# Patient Record
Sex: Female | Born: 1975 | Race: White | Hispanic: No | Marital: Married | Smoking: Former smoker
Health system: Southern US, Community
[De-identification: ages and names within clinical notes are randomized; demographics above are authoritative.]

## PROBLEM LIST (undated history)

## (undated) DIAGNOSIS — N200 Calculus of kidney: Secondary | ICD-10-CM

## (undated) DIAGNOSIS — D649 Anemia, unspecified: Secondary | ICD-10-CM

## (undated) DIAGNOSIS — G43909 Migraine, unspecified, not intractable, without status migrainosus: Secondary | ICD-10-CM

## (undated) DIAGNOSIS — F419 Anxiety disorder, unspecified: Secondary | ICD-10-CM

## (undated) DIAGNOSIS — F32A Depression, unspecified: Secondary | ICD-10-CM

## (undated) DIAGNOSIS — E559 Vitamin D deficiency, unspecified: Secondary | ICD-10-CM

## (undated) DIAGNOSIS — I10 Essential (primary) hypertension: Secondary | ICD-10-CM

## (undated) DIAGNOSIS — Z87442 Personal history of urinary calculi: Secondary | ICD-10-CM

## (undated) DIAGNOSIS — F329 Major depressive disorder, single episode, unspecified: Secondary | ICD-10-CM

## (undated) DIAGNOSIS — G4730 Sleep apnea, unspecified: Secondary | ICD-10-CM

## (undated) DIAGNOSIS — R5382 Chronic fatigue, unspecified: Secondary | ICD-10-CM

## (undated) HISTORY — DX: Sleep apnea, unspecified: G47.30

## (undated) HISTORY — PX: CHOLECYSTECTOMY: SHX55

## (undated) HISTORY — DX: Anxiety disorder, unspecified: F41.9

## (undated) HISTORY — DX: Anemia, unspecified: D64.9

## (undated) HISTORY — PX: UMBILICAL HERNIA REPAIR: SHX196

## (undated) HISTORY — DX: Vitamin D deficiency, unspecified: E55.9

## (undated) HISTORY — PX: STENT PLACE LEFT URETER (ARMC HX): HXRAD1254

## (undated) HISTORY — DX: Chronic fatigue, unspecified: R53.82

## (undated) HISTORY — PX: TUBAL LIGATION: SHX77

## (undated) HISTORY — DX: Essential (primary) hypertension: I10

---

## 2002-07-31 ENCOUNTER — Inpatient Hospital Stay (HOSPITAL_COMMUNITY): Admission: EM | Admit: 2002-07-31 | Discharge: 2002-08-01 | Payer: Self-pay | Admitting: Psychiatry

## 2004-07-19 ENCOUNTER — Ambulatory Visit: Payer: Self-pay | Admitting: Family Medicine

## 2004-12-07 ENCOUNTER — Ambulatory Visit: Payer: Self-pay | Admitting: Family Medicine

## 2004-12-23 ENCOUNTER — Ambulatory Visit: Payer: Self-pay | Admitting: Family Medicine

## 2005-02-02 ENCOUNTER — Ambulatory Visit: Payer: Self-pay | Admitting: Family Medicine

## 2005-05-24 ENCOUNTER — Ambulatory Visit: Payer: Self-pay | Admitting: Family Medicine

## 2012-08-10 ENCOUNTER — Emergency Department: Payer: Self-pay | Admitting: Emergency Medicine

## 2012-08-10 LAB — TROPONIN I: Troponin-I: <0.02 ng/mL

## 2012-08-10 LAB — BASIC METABOLIC PANEL
Anion Gap: 9 (ref 7–16)
BUN: 11 mg/dL (ref 7–18)
Calcium, Total: 11.4 mg/dL — ABNORMAL HIGH (ref 8.5–10.1)
Chloride: 107 mmol/L (ref 98–107)
Co2: 24 mmol/L (ref 21–32)
Creatinine: 0.61 mg/dL (ref 0.60–1.30)
EGFR (African American): >60
EGFR (Non-African Amer.): >60
Glucose: 90 mg/dL (ref 65–99)
Osmolality: 278 (ref 275–301)
Potassium: 3.5 mmol/L (ref 3.5–5.1)
Sodium: 140 mmol/L (ref 136–145)

## 2012-08-10 LAB — CBC
HCT: 40.8 % (ref 35.0–47.0)
HGB: 13.5 g/dL (ref 12.0–16.0)
MCH: 28.6 pg (ref 26.0–34.0)
MCHC: 33.1 g/dL (ref 32.0–36.0)
MCV: 86 fL (ref 80–100)
Platelet: 279 10*3/uL (ref 150–440)
RBC: 4.73 10*6/uL (ref 3.80–5.20)
RDW: 13.1 % (ref 11.5–14.5)
WBC: 11.8 10*3/uL — ABNORMAL HIGH (ref 3.6–11.0)

## 2012-08-11 LAB — LIPASE, BLOOD: Lipase: 124 U/L (ref 73–393)

## 2012-08-11 LAB — MAGNESIUM: Magnesium: 1.6 mg/dL — ABNORMAL LOW

## 2012-08-11 LAB — AMYLASE: Amylase: 42 U/L (ref 25–115)

## 2013-08-06 ENCOUNTER — Emergency Department (HOSPITAL_COMMUNITY)
Admission: EM | Admit: 2013-08-06 | Discharge: 2013-08-06 | Disposition: A | Discharge status: Home / Self Care | Payer: 59 | Attending: Emergency Medicine | Admitting: Emergency Medicine

## 2013-08-06 ENCOUNTER — Encounter (HOSPITAL_COMMUNITY): Payer: Self-pay | Admitting: Emergency Medicine

## 2013-08-06 DIAGNOSIS — M549 Dorsalgia, unspecified: Secondary | ICD-10-CM | POA: Insufficient documentation

## 2013-08-06 DIAGNOSIS — R300 Dysuria: Secondary | ICD-10-CM | POA: Insufficient documentation

## 2013-08-06 DIAGNOSIS — Z79899 Other long term (current) drug therapy: Secondary | ICD-10-CM | POA: Insufficient documentation

## 2013-08-06 DIAGNOSIS — Z3202 Encounter for pregnancy test, result negative: Secondary | ICD-10-CM | POA: Insufficient documentation

## 2013-08-06 DIAGNOSIS — F3289 Other specified depressive episodes: Secondary | ICD-10-CM | POA: Insufficient documentation

## 2013-08-06 DIAGNOSIS — F329 Major depressive disorder, single episode, unspecified: Secondary | ICD-10-CM | POA: Insufficient documentation

## 2013-08-06 DIAGNOSIS — R109 Unspecified abdominal pain: Secondary | ICD-10-CM | POA: Insufficient documentation

## 2013-08-06 DIAGNOSIS — G43909 Migraine, unspecified, not intractable, without status migrainosus: Secondary | ICD-10-CM | POA: Insufficient documentation

## 2013-08-06 HISTORY — DX: Depression, unspecified: F32.A

## 2013-08-06 HISTORY — DX: Migraine, unspecified, not intractable, without status migrainosus: G43.909

## 2013-08-06 HISTORY — DX: Major depressive disorder, single episode, unspecified: F32.9

## 2013-08-06 LAB — URINALYSIS, ROUTINE W REFLEX MICROSCOPIC
Bilirubin Urine: NEGATIVE
Glucose, UA: NEGATIVE mg/dL
Ketones, ur: NEGATIVE mg/dL
Leukocytes, UA: NEGATIVE
Nitrite: NEGATIVE
Protein, ur: NEGATIVE mg/dL
Specific Gravity, Urine: 1.011 (ref 1.005–1.030)
Urobilinogen, UA: 0.2 mg/dL (ref 0.0–1.0)
pH: 6 (ref 5.0–8.0)

## 2013-08-06 LAB — PREGNANCY, URINE: Preg Test, Ur: NEGATIVE

## 2013-08-06 LAB — URINE MICROSCOPIC-ADD ON

## 2013-08-06 MED ORDER — ONDANSETRON 8 MG PO TBDP
8.0000 mg | ORAL_TABLET | Freq: Once | ORAL | Status: AC
  Administered 2013-08-06: 8 mg via ORAL
  Filled 2013-08-06: qty 1

## 2013-08-06 MED ORDER — OXYCODONE-ACETAMINOPHEN 5-325 MG PO TABS
2.0000 | ORAL_TABLET | Freq: Once | ORAL | Status: AC
  Administered 2013-08-06: 2 via ORAL
  Filled 2013-08-06: qty 2

## 2013-08-06 MED ORDER — ONDANSETRON 8 MG PO TBDP: 8.0000 mg | ORAL_TABLET | Freq: Three times a day (TID) | ORAL | Status: DC | PRN

## 2013-08-06 MED ORDER — OXYCODONE-ACETAMINOPHEN 5-325 MG PO TABS: 2.0000 | ORAL_TABLET | ORAL | Status: DC | PRN

## 2013-08-06 NOTE — ED Provider Notes (Addendum)
CSN: [id number]     Arrival date & time 08/06/13  0620 History   First MD Initiated Contact with Patient 08/06/13 [phone]     Chief Complaint  Patient presents with  . Abdominal Pain  . Back Pain     (Consider location/radiation/quality/duration/timing/severity/associated sxs/prior Treatment) Patient is a 38 y.o. female presenting with abdominal pain and back pain.  Abdominal Pain Pain location:  L flank and R flank Pain quality: pressure   Pain radiates to:  Suprapubic region Pain severity:  Moderate Onset quality:  Sudden Duration:  1 day Timing:  Constant Progression:  Worsening Chronicity:  New Relieved by:  Nothing Worsened by:  Nothing tried Ineffective treatments:  None tried Associated symptoms: dysuria   Associated symptoms: no fever, no nausea and no vomiting   Back Pain Associated symptoms: abdominal pain and dysuria   Associated symptoms: no fever     Past Medical History  Diagnosis Date  . Migraines   . Depression    History reviewed. No pertinent past surgical history. History reviewed. No pertinent family history. History  Substance Use Topics  . Smoking status: Never Smoker   . Smokeless tobacco: Not on file  . Alcohol Use: No   OB History   Grav Para Term Preterm Abortions TAB SAB Ect Mult Living                 Review of Systems  Constitutional: Negative for fever.  Gastrointestinal: Positive for abdominal pain. Negative for nausea and vomiting.  Genitourinary: Positive for dysuria.  Musculoskeletal: Positive for back pain.  All other systems reviewed and are negative.      Allergies  Review of patient's allergies indicates no known allergies.  Home Medications   Current Outpatient Rx  Name  Route  Sig  Dispense  Refill  . DULoxetine (CYMBALTA) 60 MG capsule   Oral   Take 60 mg by mouth daily.         . Lurasidone HCl (LATUDA) 20 MG TABS   Oral   Take 20 mg by mouth daily.         Marland Kitchen topiramate (TOPAMAX) 100 MG tablet  Oral   Take 100 mg by mouth daily.          BP 144/89  Pulse 69  Temp(Src) 97.9 F (36.6 C) (Oral)  Resp 20  Ht 5\' 2"  (1.575 m)  Wt 162 lb (73.483 kg)  BMI 29.62 kg/m2  SpO2 97%  LMP 08/06/2013 Physical Exam  Nursing note and vitals reviewed. Constitutional: She is oriented to person, place, and time. She appears well-developed and well-nourished.  Non-toxic appearance. No distress.  HENT:  Head: Normocephalic and atraumatic.  Eyes: Conjunctivae, EOM and lids are normal. Pupils are equal, round, and reactive to light.  Neck: Normal range of motion. Neck supple. No tracheal deviation present. No mass present.  Cardiovascular: Normal rate, regular rhythm and normal heart sounds.  Exam reveals no gallop.   No murmur heard. Pulmonary/Chest: Effort normal and breath sounds normal. No stridor. No respiratory distress. She has no decreased breath sounds. She has no wheezes. She has no rhonchi. She has no rales.  Abdominal: Soft. Normal appearance and bowel sounds are normal. She exhibits no distension. There is no tenderness. There is CVA tenderness. There is no rigidity, no rebound and no guarding.  Musculoskeletal: Normal range of motion. She exhibits no edema and no tenderness.  Neurological: She is alert and oriented to person, place, and time. She has normal strength. No  cranial nerve deficit or sensory deficit. GCS eye subscore is 4. GCS verbal subscore is 5. GCS motor subscore is 6.  Skin: Skin is warm and dry. No abrasion and no rash noted.  Psychiatric: She has a normal mood and affect. Her speech is normal and behavior is normal.    ED Course  Procedures (including critical care time) Labs Review Labs Reviewed  URINALYSIS, [place], URINE   Imaging Review No results found.  EKG Interpretation   None       MDM   Final diagnoses:  None  pt has no sings of acute abd of pelvic pain, doubt ovarian torsion or PID  Pt given percocet  and feels better--  D/w about obtaining an abd/pelvic ct to r/o kidney stone and she would like to defer at this time and will be tx symptomatically and return if her sx worsen    Leota Jacobsen, MD 08/06/13 [place], MD 08/06/13 [phone]

## 2013-08-06 NOTE — ED Notes (Signed)
Pt complains of low back pain that radiates around to her lower abdomen

## 2013-08-06 NOTE — Discharge Instructions (Signed)
Flank Pain Flank pain refers to pain that is located on the side of the body between the upper abdomen and the back. The pain may occur over a short period of time (acute) or may be long-term or reoccurring (chronic). It may be mild or severe. Flank pain can be caused by many things. CAUSES  Some of the more common causes of flank pain include:  Muscle strains.   Muscle spasms.   A disease of your spine (vertebral disk disease).   A lung infection (pneumonia).   Fluid around your lungs (pulmonary edema).   A kidney infection.   Kidney stones.   A very painful skin rash caused by the chickenpox virus (shingles).   Gallbladder disease.  Twilight care will depend on the cause of your pain. In general,  Rest as directed by your caregiver.  Drink enough fluids to keep your urine clear or pale yellow.  Only take over-the-counter or prescription medicines as directed by your caregiver. Some medicines may help relieve the pain.  Tell your caregiver about any changes in your pain.  Follow up with your caregiver as directed. SEEK IMMEDIATE MEDICAL CARE IF:   Your pain is not controlled with medicine.   You have new or worsening symptoms.  Your pain increases.   You have abdominal pain.   You have shortness of breath.   You have persistent nausea or vomiting.   You have swelling in your abdomen.   You feel faint or pass out.   You have blood in your urine.  You have a fever or persistent symptoms for more than 2 3 days.  You have a fever and your symptoms suddenly get worse. MAKE SURE YOU:   Understand these instructions.  Will watch your condition.  Will get help right away if you are not doing well or get worse. Document Released: 07/21/2005 Document Revised: 02/22/2012 Document Reviewed: 01/12/2012 [facility] Patient Information 2014 [place].  Abdominal Pain, Women Abdominal (stomach, pelvic, or belly) pain can be  caused by many things. It is important to tell your doctor:  The location of the pain.  Does it come and go or is it present all the time?  Are there things that start the pain (eating certain foods, exercise)?  Are there other symptoms associated with the pain (fever, nausea, vomiting, diarrhea)? All of this is helpful to know when trying to find the cause of the pain. CAUSES   Stomach: virus or bacteria infection, or ulcer.  Intestine: appendicitis (inflamed appendix), regional ileitis (Crohn's disease), ulcerative colitis (inflamed colon), irritable bowel syndrome, diverticulitis (inflamed diverticulum of the colon), or cancer of the stomach or intestine.  Gallbladder disease or stones in the gallbladder.  Kidney disease, kidney stones, or infection.  Pancreas infection or cancer.  Fibromyalgia (pain disorder).  Diseases of the female organs:  Uterus: fibroid (non-cancerous) tumors or infection.  Fallopian tubes: infection or tubal pregnancy.  Ovary: cysts or tumors.  Pelvic adhesions (scar tissue).  Endometriosis (uterus lining tissue growing in the pelvis and on the pelvic organs).  Pelvic congestion syndrome (female organs filling up with blood just before the menstrual period).  Pain with the menstrual period.  Pain with ovulation (producing an egg).  Pain with an IUD (intrauterine device, birth control) in the uterus.  Cancer of the female organs.  Functional pain (pain not caused by a disease, may improve without treatment).  Psychological pain.  Depression. DIAGNOSIS  Your doctor will decide the seriousness of  your pain by doing an examination.  Blood tests.  X-rays.  Ultrasound.  CT scan (computed tomography, special type of X-ray).  MRI (magnetic resonance imaging).  Cultures, for infection.  Barium enema (dye inserted in the large intestine, to better view it with X-rays).  Colonoscopy (looking in intestine with a lighted  tube).  Laparoscopy (minor surgery, looking in abdomen with a lighted tube).  Major abdominal exploratory surgery (looking in abdomen with a large incision). TREATMENT  The treatment will depend on the cause of the pain.   Many cases can be observed and treated at home.  Over-the-counter medicines recommended by your caregiver.  Prescription medicine.  Antibiotics, for infection.  Birth control pills, for painful periods or for ovulation pain.  Hormone treatment, for endometriosis.  Nerve blocking injections.  Physical therapy.  Antidepressants.  Counseling with a psychologist or psychiatrist.  Minor or major surgery. HOME CARE INSTRUCTIONS   Do not take laxatives, unless directed by your caregiver.  Take over-the-counter pain medicine only if ordered by your caregiver. Do not take aspirin because it can cause an upset stomach or bleeding.  Try a clear liquid diet (broth or water) as ordered by your caregiver. Slowly move to a bland diet, as tolerated, if the pain is related to the stomach or intestine.  Have a thermometer and take your temperature several times a day, and record it.  Bed rest and sleep, if it helps the pain.  Avoid sexual intercourse, if it causes pain.  Avoid stressful situations.  Keep your follow-up appointments and tests, as your caregiver orders.  If the pain does not go away with medicine or surgery, you may try:  Acupuncture.  Relaxation exercises (yoga, meditation).  Group therapy.  Counseling. SEEK MEDICAL CARE IF:   You notice certain foods cause stomach pain.  Your home care treatment is not helping your pain.  You need stronger pain medicine.  You want your IUD removed.  You feel faint or lightheaded.  You develop nausea and vomiting.  You develop a rash.  You are having side effects or an allergy to your medicine. SEEK IMMEDIATE MEDICAL CARE IF:   Your pain does not go away or gets worse.  You have a  fever.  Your pain is felt only in portions of the abdomen. The right side could possibly be appendicitis. The left lower portion of the abdomen could be colitis or diverticulitis.  You are passing blood in your stools (bright red or black tarry stools, with or without vomiting).  You have blood in your urine.  You develop chills, with or without a fever.  You pass out. MAKE SURE YOU:   Understand these instructions.  Will watch your condition.  Will get help right away if you are not doing well or get worse. Document Released: 03/27/2007 Document Revised: 08/22/2011 Document Reviewed: 04/16/2009 [facility] Patient Information 2014 [place], [place].

## 2014-09-16 ENCOUNTER — Encounter: Payer: Self-pay | Admitting: *Deleted

## 2015-05-05 ENCOUNTER — Other Ambulatory Visit (HOSPITAL_COMMUNITY): Payer: Self-pay | Admitting: Family Medicine

## 2015-06-09 ENCOUNTER — Emergency Department: Payer: PRIVATE HEALTH INSURANCE

## 2015-06-09 ENCOUNTER — Emergency Department
Admission: EM | Admit: 2015-06-09 | Discharge: 2015-06-09 | Disposition: A | Discharge status: Home / Self Care | Payer: PRIVATE HEALTH INSURANCE | Attending: Emergency Medicine | Admitting: Emergency Medicine

## 2015-06-09 DIAGNOSIS — K429 Umbilical hernia without obstruction or gangrene: Secondary | ICD-10-CM | POA: Diagnosis not present

## 2015-06-09 DIAGNOSIS — Z3202 Encounter for pregnancy test, result negative: Secondary | ICD-10-CM | POA: Insufficient documentation

## 2015-06-09 DIAGNOSIS — R109 Unspecified abdominal pain: Secondary | ICD-10-CM | POA: Diagnosis present

## 2015-06-09 HISTORY — DX: Calculus of kidney: N20.0

## 2015-06-09 LAB — COMPREHENSIVE METABOLIC PANEL
ALT: 21 U/L (ref 14–54)
AST: 17 U/L (ref 15–41)
Albumin: 4.4 g/dL (ref 3.5–5.0)
Alkaline Phosphatase: 49 U/L (ref 38–126)
Anion gap: 7 (ref 5–15)
BUN: 16 mg/dL (ref 6–20)
CO2: 26 mmol/L (ref 22–32)
Calcium: 9.4 mg/dL (ref 8.9–10.3)
Chloride: 106 mmol/L (ref 101–111)
Creatinine, Ser: 0.96 mg/dL (ref 0.44–1.00)
GFR calc Af Amer: >60 mL/min (ref >60)
GFR calc non Af Amer: >60 mL/min (ref >60)
Glucose, Bld: 98 mg/dL (ref 65–99)
Potassium: 3.7 mmol/L (ref 3.5–5.1)
Sodium: 139 mmol/L (ref 135–145)
Total Bilirubin: 0.6 mg/dL (ref 0.3–1.2)
Total Protein: 7.5 g/dL (ref 6.5–8.1)

## 2015-06-09 LAB — CBC WITH DIFFERENTIAL/PLATELET
Basophils Absolute: 0.1 10*3/uL (ref 0–0.1)
Basophils Relative: 1 %
Eosinophils Absolute: 0.3 10*3/uL (ref 0–0.7)
Eosinophils Relative: 3 %
HCT: 37.7 % (ref 35.0–47.0)
Hemoglobin: 12.8 g/dL (ref 12.0–16.0)
Lymphocytes Relative: 35 %
Lymphs Abs: 3.3 10*3/uL (ref 1.0–3.6)
MCH: 28.9 pg (ref 26.0–34.0)
MCHC: 33.9 g/dL (ref 32.0–36.0)
MCV: 85.4 fL (ref 80.0–100.0)
Monocytes Absolute: 0.9 10*3/uL (ref 0.2–0.9)
Monocytes Relative: 9 %
Neutro Abs: 4.8 10*3/uL (ref 1.4–6.5)
Neutrophils Relative %: 52 %
Platelets: 286 10*3/uL (ref 150–440)
RBC: 4.41 MIL/uL (ref 3.80–5.20)
RDW: 13.9 % (ref 11.5–14.5)
WBC: 9.4 10*3/uL (ref 3.6–11.0)

## 2015-06-09 LAB — URINALYSIS COMPLETE WITH MICROSCOPIC (ARMC ONLY)
Bilirubin Urine: NEGATIVE
Glucose, UA: NEGATIVE mg/dL
Hgb urine dipstick: NEGATIVE
Ketones, ur: NEGATIVE mg/dL
Leukocytes, UA: NEGATIVE
Nitrite: NEGATIVE
Protein, ur: NEGATIVE mg/dL
Specific Gravity, Urine: 1.024 (ref 1.005–1.030)
pH: 6 (ref 5.0–8.0)

## 2015-06-09 LAB — POCT PREGNANCY, URINE: Preg Test, Ur: NEGATIVE

## 2015-06-09 MED ORDER — IOHEXOL 350 MG/ML SOLN
100.0000 mL | Freq: Once | INTRAVENOUS | Status: AC | PRN
  Administered 2015-06-09: 100 mL via INTRAVENOUS

## 2015-06-09 MED ORDER — HYDROCODONE-ACETAMINOPHEN 5-325 MG PO TABS: 1.0000 | ORAL_TABLET | Freq: Four times a day (QID) | ORAL | Status: DC | PRN

## 2015-06-09 MED ORDER — ONDANSETRON HCL 4 MG/2ML IJ SOLN
4.0000 mg | Freq: Once | INTRAMUSCULAR | Status: AC
  Administered 2015-06-09: 4 mg via INTRAVENOUS
  Filled 2015-06-09: qty 2

## 2015-06-09 MED ORDER — MORPHINE SULFATE (PF) 4 MG/ML IV SOLN
4.0000 mg | Freq: Once | INTRAVENOUS | Status: AC
  Administered 2015-06-09: 4 mg via INTRAVENOUS
  Filled 2015-06-09: qty 1

## 2015-06-09 MED ORDER — IOHEXOL 300 MG/ML  SOLN: 100.0000 mL | Freq: Once | INTRAMUSCULAR | Status: DC | PRN

## 2015-06-09 MED ORDER — HYDROCODONE-ACETAMINOPHEN 5-325 MG PO TABS
2.0000 | ORAL_TABLET | Freq: Once | ORAL | Status: AC
  Administered 2015-06-09: 2 via ORAL
  Filled 2015-06-09: qty 2

## 2015-06-09 MED ORDER — ONDANSETRON HCL 4 MG PO TABS: 4.0000 mg | ORAL_TABLET | Freq: Every day | ORAL | Status: AC | PRN

## 2015-06-09 MED ORDER — MORPHINE SULFATE (PF) 4 MG/ML IV SOLN
INTRAVENOUS | Status: AC
  Administered 2015-06-09: 4 mg via INTRAVENOUS
  Filled 2015-06-09: qty 1

## 2015-06-09 MED ORDER — MORPHINE SULFATE (PF) 4 MG/ML IV SOLN
4.0000 mg | Freq: Once | INTRAVENOUS | Status: AC
  Administered 2015-06-09: 4 mg via INTRAVENOUS

## 2015-06-09 MED ORDER — IOHEXOL 240 MG/ML SOLN
25.0000 mL | Freq: Once | INTRAMUSCULAR | Status: AC | PRN
  Administered 2015-06-09: 25 mL via ORAL

## 2015-06-09 NOTE — ED Notes (Signed)
MD attempted to reduce umbilical hernia, once pressure is removed, hernia moves back into umbilical area. Ice pack applied. Pt reports pain 7/10. VSS, will continue to monitor.

## 2015-06-09 NOTE — Discharge Instructions (Signed)
Hernia A hernia happens when an organ or tissue inside your body pushes out through a weak spot in the belly (abdomen). HOME CARE  Avoid stretching or overusing (straining) the muscles near the hernia.  Do not lift anything heavier than 10 lb (4.5 kg).  Use the muscles in your leg when you lift something up. Do not use the muscles in your back.  When you cough, try to cough gently.  Eat a diet that has a lot of fiber. Eat lots of fruits and vegetables.  Drink enough fluids to keep your pee (urine) clear or pale yellow. Try to drink 6-8 glasses of water a day.  Take medicines to make your poop soft (stool softeners) as told by your doctor.  Lose weight, if you are overweight.  Do not use any tobacco products, including cigarettes, chewing tobacco, or electronic cigarettes. If you need help quitting, ask your doctor.  Keep all follow-up visits as told by your doctor. This is important. GET HELP IF:  The skin by the hernia gets puffy (swollen) or red.  The hernia is painful. GET HELP RIGHT AWAY IF:  You have a fever.  You have belly pain that is getting worse.  You feel sick to your stomach (nauseous) or you throw up (vomit).  You cannot push the hernia back in place by gently pressing on it while you are lying down.  The hernia:  Changes in shape or size.  Is stuck outside your belly.  Changes color.  Feels hard or tender.   This information is not intended to replace advice given to you by your health care provider. Make sure you discuss any questions you have with your health care provider.   Document Released: 11/17/2009 Document Revised: 06/20/2014 Document Reviewed: 04/09/2014 Elsevier Interactive Patient Education 2016 [employer]  Umbilical Herniorrhaphy Herniorrhaphy is surgery to repair a hernia. A hernia is the protrusion of a part of an organ through an abdominal opening. An umbilical hernia means that your hernia is in the area around your navel. If  the hernia is not repaired, the gap could get bigger. Your intestines or other tissues, such as fat, could get trapped in the gap. This can lead to other health problems, such as blocked intestines. If the hernia is fixed before problems set in, you may be allowed to go home the same day as the surgery (outpatient). LET [facility] CARE PROVIDER KNOW ABOUT:  Allergies to food or medicine.  Medicines taken, including vitamins, herbs, eye drops, over-the-counter medicines, and creams.  Use of steroids (by mouth or creams).  Previous problems with anesthetics or numbing medicines.  History of bleeding problems or blood clots.  Previous surgery.  Other health problems, including diabetes and kidney problems.  Possibility of pregnancy, if this applies. RISKS AND COMPLICATIONS  Pain.  Excessive bleeding.  Hematoma. This is a pocket of blood that collects under the surgery site.  Infection at the surgery site.  Numbness at the surgery site.  Swelling and bruising.  Blood clots.  Intestinal damage (rare).  Scarring.  Skin damage.  Development of another hernia. This may require another surgery. BEFORE THE PROCEDURE  Ask your health care provider about changing or stopping your regular medicines. You may need to stop taking aspirin, nonsteroidal anti-inflammatory drugs (NSAIDs), vitamin E, and blood thinners as early as 2 weeks before the procedure.  Do not eat or drink for 8 hours before the procedure, or as directed by your health care provider.  You  might be asked to shower or wash with an antibacterial soap before the procedure.  Wear comfortable clothes that will be easy to put on after the procedure. PROCEDURE You will be given an intravenous (IV) tube. A needle will be inserted in your arm. Medicine will flow directly into your body through this needle. You might be given medicine to help you relax (sedative). You will be given medicine that numbs the area (local  anesthetic) or medicine that makes you sleep (general anesthetic). If you have open surgery:  The surgeon will make a cut (incision) in your abdomen.  The gap in the muscle wall will be repaired. The surgeon may sew the edges together over the gap or use a mesh material to strengthen the area. When mesh is used, the body grows new, strong tissue into and around it. This new tissue closes the gap.  A drain might be put in to remove excess fluid from the body after surgery.  The surgeon will close the incision with stitches, glue, or staples. If you have laparoscopic surgery:  The surgeon will make several small incisions in your abdomen.  A thin, lighted tube (laparoscope) will be inserted into the abdomen through an incision. A camera is attached to the laparoscope that allows the surgeon to see inside the abdomen.  Tools will be inserted through the other incisions to repair the hernia. Usually, mesh is used to cover the gap.  The surgeon will close the incisions with stitches. AFTER THE PROCEDURE  You will be taken to a recovery area. A nurse will watch and check your progress.  When you are awake, feeling well, and taking fluids well, you may be allowed to go home. In some cases, you may need to stay overnight in the hospital.  Arrange for someone to drive you home.   This information is not intended to replace advice given to you by your health care provider. Make sure you discuss any questions you have with your health care provider.   Document Released: 08/26/2008 Document Revised: 06/20/2014 Document Reviewed: 08/31/2011 Elsevier Interactive Patient Education [employer].

## 2015-06-09 NOTE — ED Notes (Signed)
Pt states about 4am while at work she felt something pull in her abd, pt has swelling and pain to the umbilicus with discoloration

## 2015-06-09 NOTE — ED Provider Notes (Signed)
[facility] Emergency Department Provider Note     Time seen: ----------------------------------------- 7:45 AM on 06/09/2015 -----------------------------------------    I have reviewed the triage vital signs and the nursing notes.   HISTORY  Chief Complaint Abdominal Pain and Hernia    HPI Amanda Marks is a 39 y.o. female who presents to the ER after having acute abdominal pain. Patient states about 4 AM while at work here she had swelling and pain to the umbilicus. She denies fevers chills, vomiting or diarrhea. Patient felt like something ripped and protruded from the umbilicus.   Past Medical History  Diagnosis Date  . Migraines   . Depression   . Kidney stones     There are no active problems to display for this patient.   Past Surgical History  Procedure Laterality Date  . Cholecystectomy    . Stent place left ureter ([facility] hx)    . Tubal ligation      Allergies Review of patient's allergies indicates no known allergies.  Social History Social History  Substance Use Topics  . Smoking status: Never Smoker   . Smokeless tobacco: None  . Alcohol Use: No    Review of Systems Constitutional: Negative for fever. Eyes: Negative for visual changes. ENT: Negative for sore throat. Cardiovascular: Negative for chest pain. Respiratory: Negative for shortness of breath. Gastrointestinal: Positive for abdominal pain, negative for vomiting or diarrhea Genitourinary: Negative for dysuria. Musculoskeletal: Negative for back pain. Skin: Negative for rash. Neurological: Negative for headaches, focal weakness or numbness.  10-point ROS otherwise negative.  ____________________________________________   PHYSICAL EXAM:  VITAL SIGNS: ED Triage Vitals  Enc Vitals Group     BP 06/09/15 0740 143/93 mmHg     Pulse Rate 06/09/15 0740 80     Resp 06/09/15 0740 18     Temp 06/09/15 0740 97.7 F (36.5 C)     Temp Source 06/09/15 0740  Oral     SpO2 06/09/15 0740 98 %     Weight 06/09/15 0740 180 lb (81.647 kg)     Height 06/09/15 0740 5\' 2"  (1.575 m)     Head Cir --      Peak Flow --      Pain Score 06/09/15 0741 7     Pain Loc --      Pain Edu? --      Excl. in [place]? --     Constitutional: Alert and oriented. Well appearing and in mild distress Eyes: Conjunctivae are normal. PERRL. Normal extraocular movements. ENT   Head: Normocephalic and atraumatic.   Nose: No congestion/rhinnorhea.   Mouth/Throat: Mucous membranes are moist.   Neck: No stridor. Cardiovascular: Normal rate, regular rhythm. Normal and symmetric distal pulses are present in all extremities. No murmurs, rubs, or gallops. Respiratory: Normal respiratory effort without tachypnea nor retractions. Breath sounds are clear and equal bilaterally. No wheezes/rales/rhonchi. Gastrointestinal: There is a small reducible umbilical hernia, just to the left of midline. Area is tender to touch. Palpable bowel before reduction. Normal bowel sounds. Musculoskeletal: Nontender with normal range of motion in all extremities. No joint effusions.  No lower extremity tenderness nor edema. Neurologic:  Normal speech and language. No gross focal neurologic deficits are appreciated. Speech is normal. No gait instability. Skin:  Skin is warm, dry and intact. No rash noted. Psychiatric: Mood and affect are normal. Speech and behavior are normal. Patient exhibits appropriate insight and judgment. ___________________________________________  ED COURSE:  Pertinent labs & imaging results that were  available during my care of the patient were reviewed by me and considered in my medical decision making (see chart for details). Patients in no acute distress, has a reducible umbilical hernia. ____________________________________________    LABS (pertinent positives/negatives)  Labs Reviewed  URINALYSIS COMPLETEWITH MICROSCOPIC ([facility] ONLY) - Abnormal; Notable for  the following:    Color, Urine YELLOW (*)    APPearance HAZY (*)    Bacteria, UA RARE (*)    Squamous Epithelial / LPF 6-30 (*)    All other components within normal limits  CBC WITH DIFFERENTIAL/PLATELET  COMPREHENSIVE METABOLIC PANEL  POC URINE PREG, ED  POCT PREGNANCY, URINE       Color, Urine YELLOW (*)    APPearance HAZY (*)    Bacteria, UA RARE (*)    Squamous Epithelial / LPF 6-30 (*)    All other components within normal limits  CBC WITH DIFFERENTIAL/PLATELET  COMPREHENSIVE METABOLIC PANEL  POC URINE PREG, ED  POCT PREGNANCY, URINE    RADIOLOGY Images were viewed by me  CT abdomen and pelvis IMPRESSION: 1. Mildly increased size of fat-containing umbilical hernia. 2. Tiny nonobstructing right renal calculus. ____________________________________________  FINAL ASSESSMENT AND PLAN  Umbilical hernia  Plan: Patient with labs and imaging as dictated above. Patient with a fat containing umbilical hernia, she'll be placed in an abdominal binder, have discuss with general surgery on-call, she'll receive pain medicine and outpatient referral for laparoscopic umbilical hernia repair.   Earleen Newport, MD   Earleen Newport, MD 06/09/15 [phone]

## 2015-06-16 DIAGNOSIS — K432 Incisional hernia without obstruction or gangrene: Secondary | ICD-10-CM | POA: Diagnosis not present

## 2015-06-16 DIAGNOSIS — Z6833 Body mass index (BMI) 33.0-33.9, adult: Secondary | ICD-10-CM | POA: Diagnosis not present

## 2015-06-22 DIAGNOSIS — K430 Incisional hernia with obstruction, without gangrene: Secondary | ICD-10-CM | POA: Diagnosis not present

## 2015-06-22 DIAGNOSIS — K429 Umbilical hernia without obstruction or gangrene: Secondary | ICD-10-CM | POA: Diagnosis not present

## 2015-06-22 DIAGNOSIS — Z9049 Acquired absence of other specified parts of digestive tract: Secondary | ICD-10-CM | POA: Diagnosis not present

## 2015-06-22 DIAGNOSIS — K219 Gastro-esophageal reflux disease without esophagitis: Secondary | ICD-10-CM | POA: Diagnosis not present

## 2015-06-22 DIAGNOSIS — G43909 Migraine, unspecified, not intractable, without status migrainosus: Secondary | ICD-10-CM | POA: Diagnosis not present

## 2015-07-13 DIAGNOSIS — F3131 Bipolar disorder, current episode depressed, mild: Secondary | ICD-10-CM | POA: Diagnosis not present

## 2015-09-08 DIAGNOSIS — F4310 Post-traumatic stress disorder, unspecified: Secondary | ICD-10-CM | POA: Diagnosis not present

## 2015-09-14 DIAGNOSIS — G43109 Migraine with aura, not intractable, without status migrainosus: Secondary | ICD-10-CM | POA: Diagnosis not present

## 2015-09-14 DIAGNOSIS — R5383 Other fatigue: Secondary | ICD-10-CM | POA: Diagnosis not present

## 2015-09-30 DIAGNOSIS — F4310 Post-traumatic stress disorder, unspecified: Secondary | ICD-10-CM | POA: Diagnosis not present

## 2015-10-06 DIAGNOSIS — N63 Unspecified lump in breast: Secondary | ICD-10-CM | POA: Diagnosis not present

## 2015-10-14 DIAGNOSIS — N63 Unspecified lump in breast: Secondary | ICD-10-CM | POA: Diagnosis not present

## 2015-10-14 DIAGNOSIS — N6002 Solitary cyst of left breast: Secondary | ICD-10-CM | POA: Diagnosis not present

## 2015-10-16 DIAGNOSIS — F4310 Post-traumatic stress disorder, unspecified: Secondary | ICD-10-CM | POA: Diagnosis not present

## 2015-10-27 DIAGNOSIS — F3131 Bipolar disorder, current episode depressed, mild: Secondary | ICD-10-CM | POA: Diagnosis not present

## 2015-11-02 DIAGNOSIS — F4310 Post-traumatic stress disorder, unspecified: Secondary | ICD-10-CM | POA: Diagnosis not present

## 2015-11-20 DIAGNOSIS — F331 Major depressive disorder, recurrent, moderate: Secondary | ICD-10-CM | POA: Diagnosis not present

## 2015-11-23 DIAGNOSIS — F3131 Bipolar disorder, current episode depressed, mild: Secondary | ICD-10-CM | POA: Diagnosis not present

## 2015-12-18 DIAGNOSIS — F331 Major depressive disorder, recurrent, moderate: Secondary | ICD-10-CM | POA: Diagnosis not present

## 2015-12-23 DIAGNOSIS — F3131 Bipolar disorder, current episode depressed, mild: Secondary | ICD-10-CM | POA: Diagnosis not present

## 2016-01-06 DIAGNOSIS — F331 Major depressive disorder, recurrent, moderate: Secondary | ICD-10-CM | POA: Diagnosis not present

## 2016-01-07 DIAGNOSIS — K430 Incisional hernia with obstruction, without gangrene: Secondary | ICD-10-CM | POA: Diagnosis not present

## 2016-01-29 DIAGNOSIS — F331 Major depressive disorder, recurrent, moderate: Secondary | ICD-10-CM | POA: Diagnosis not present

## 2016-02-19 DIAGNOSIS — F331 Major depressive disorder, recurrent, moderate: Secondary | ICD-10-CM | POA: Diagnosis not present

## 2016-03-08 DIAGNOSIS — F419 Anxiety disorder, unspecified: Secondary | ICD-10-CM | POA: Diagnosis not present

## 2016-03-08 DIAGNOSIS — F39 Unspecified mood [affective] disorder: Secondary | ICD-10-CM | POA: Diagnosis not present

## 2016-03-08 DIAGNOSIS — Z79899 Other long term (current) drug therapy: Secondary | ICD-10-CM | POA: Diagnosis not present

## 2016-03-11 DIAGNOSIS — R079 Chest pain, unspecified: Secondary | ICD-10-CM | POA: Diagnosis not present

## 2016-03-11 DIAGNOSIS — R0789 Other chest pain: Secondary | ICD-10-CM | POA: Diagnosis not present

## 2016-03-15 DIAGNOSIS — F411 Generalized anxiety disorder: Secondary | ICD-10-CM | POA: Diagnosis not present

## 2016-03-23 DIAGNOSIS — F411 Generalized anxiety disorder: Secondary | ICD-10-CM | POA: Diagnosis not present

## 2016-03-29 DIAGNOSIS — K432 Incisional hernia without obstruction or gangrene: Secondary | ICD-10-CM | POA: Diagnosis not present

## 2016-03-29 DIAGNOSIS — F411 Generalized anxiety disorder: Secondary | ICD-10-CM | POA: Diagnosis not present

## 2016-03-29 DIAGNOSIS — Z6833 Body mass index (BMI) 33.0-33.9, adult: Secondary | ICD-10-CM | POA: Diagnosis not present

## 2016-04-06 DIAGNOSIS — F411 Generalized anxiety disorder: Secondary | ICD-10-CM | POA: Diagnosis not present

## 2016-04-11 DIAGNOSIS — Z87891 Personal history of nicotine dependence: Secondary | ICD-10-CM | POA: Diagnosis not present

## 2016-04-11 DIAGNOSIS — K219 Gastro-esophageal reflux disease without esophagitis: Secondary | ICD-10-CM | POA: Diagnosis not present

## 2016-04-11 DIAGNOSIS — K430 Incisional hernia with obstruction, without gangrene: Secondary | ICD-10-CM | POA: Diagnosis not present

## 2016-04-11 DIAGNOSIS — K432 Incisional hernia without obstruction or gangrene: Secondary | ICD-10-CM | POA: Diagnosis not present

## 2016-04-12 DIAGNOSIS — K219 Gastro-esophageal reflux disease without esophagitis: Secondary | ICD-10-CM | POA: Diagnosis not present

## 2016-04-12 DIAGNOSIS — K430 Incisional hernia with obstruction, without gangrene: Secondary | ICD-10-CM | POA: Diagnosis not present

## 2016-04-12 DIAGNOSIS — Z87891 Personal history of nicotine dependence: Secondary | ICD-10-CM | POA: Diagnosis not present

## 2016-04-13 DIAGNOSIS — Z87891 Personal history of nicotine dependence: Secondary | ICD-10-CM | POA: Diagnosis not present

## 2016-04-13 DIAGNOSIS — K219 Gastro-esophageal reflux disease without esophagitis: Secondary | ICD-10-CM | POA: Diagnosis not present

## 2016-04-13 DIAGNOSIS — K430 Incisional hernia with obstruction, without gangrene: Secondary | ICD-10-CM | POA: Diagnosis not present

## 2016-04-15 DIAGNOSIS — F411 Generalized anxiety disorder: Secondary | ICD-10-CM | POA: Diagnosis not present

## 2016-04-26 DIAGNOSIS — F411 Generalized anxiety disorder: Secondary | ICD-10-CM | POA: Diagnosis not present

## 2016-05-13 DIAGNOSIS — F411 Generalized anxiety disorder: Secondary | ICD-10-CM | POA: Diagnosis not present

## 2016-06-01 DIAGNOSIS — F411 Generalized anxiety disorder: Secondary | ICD-10-CM | POA: Diagnosis not present

## 2016-07-11 DIAGNOSIS — F3131 Bipolar disorder, current episode depressed, mild: Secondary | ICD-10-CM | POA: Diagnosis not present

## 2016-07-13 DIAGNOSIS — F411 Generalized anxiety disorder: Secondary | ICD-10-CM | POA: Diagnosis not present

## 2016-07-22 DIAGNOSIS — F411 Generalized anxiety disorder: Secondary | ICD-10-CM | POA: Diagnosis not present

## 2016-08-11 DIAGNOSIS — F411 Generalized anxiety disorder: Secondary | ICD-10-CM | POA: Diagnosis not present

## 2016-09-05 DIAGNOSIS — F3131 Bipolar disorder, current episode depressed, mild: Secondary | ICD-10-CM | POA: Diagnosis not present

## 2016-09-08 DIAGNOSIS — F411 Generalized anxiety disorder: Secondary | ICD-10-CM | POA: Diagnosis not present

## 2016-09-12 DIAGNOSIS — M9902 Segmental and somatic dysfunction of thoracic region: Secondary | ICD-10-CM | POA: Diagnosis not present

## 2016-09-12 DIAGNOSIS — M9903 Segmental and somatic dysfunction of lumbar region: Secondary | ICD-10-CM | POA: Diagnosis not present

## 2016-09-12 DIAGNOSIS — S39012A Strain of muscle, fascia and tendon of lower back, initial encounter: Secondary | ICD-10-CM | POA: Diagnosis not present

## 2016-09-12 DIAGNOSIS — S29019A Strain of muscle and tendon of unspecified wall of thorax, initial encounter: Secondary | ICD-10-CM | POA: Diagnosis not present

## 2016-09-12 DIAGNOSIS — M5136 Other intervertebral disc degeneration, lumbar region: Secondary | ICD-10-CM | POA: Diagnosis not present

## 2016-09-12 DIAGNOSIS — M5032 Other cervical disc degeneration, mid-cervical region, unspecified level: Secondary | ICD-10-CM | POA: Diagnosis not present

## 2016-09-12 DIAGNOSIS — M542 Cervicalgia: Secondary | ICD-10-CM | POA: Diagnosis not present

## 2016-09-12 DIAGNOSIS — M9901 Segmental and somatic dysfunction of cervical region: Secondary | ICD-10-CM | POA: Diagnosis not present

## 2016-09-21 DIAGNOSIS — S39012A Strain of muscle, fascia and tendon of lower back, initial encounter: Secondary | ICD-10-CM | POA: Diagnosis not present

## 2016-09-21 DIAGNOSIS — M9902 Segmental and somatic dysfunction of thoracic region: Secondary | ICD-10-CM | POA: Diagnosis not present

## 2016-09-21 DIAGNOSIS — M542 Cervicalgia: Secondary | ICD-10-CM | POA: Diagnosis not present

## 2016-09-21 DIAGNOSIS — M5032 Other cervical disc degeneration, mid-cervical region, unspecified level: Secondary | ICD-10-CM | POA: Diagnosis not present

## 2016-09-21 DIAGNOSIS — M9901 Segmental and somatic dysfunction of cervical region: Secondary | ICD-10-CM | POA: Diagnosis not present

## 2016-09-21 DIAGNOSIS — M9903 Segmental and somatic dysfunction of lumbar region: Secondary | ICD-10-CM | POA: Diagnosis not present

## 2016-09-21 DIAGNOSIS — M5136 Other intervertebral disc degeneration, lumbar region: Secondary | ICD-10-CM | POA: Diagnosis not present

## 2016-09-21 DIAGNOSIS — S29019A Strain of muscle and tendon of unspecified wall of thorax, initial encounter: Secondary | ICD-10-CM | POA: Diagnosis not present

## 2016-09-22 DIAGNOSIS — M9901 Segmental and somatic dysfunction of cervical region: Secondary | ICD-10-CM | POA: Diagnosis not present

## 2016-09-22 DIAGNOSIS — S29019A Strain of muscle and tendon of unspecified wall of thorax, initial encounter: Secondary | ICD-10-CM | POA: Diagnosis not present

## 2016-09-22 DIAGNOSIS — M5032 Other cervical disc degeneration, mid-cervical region, unspecified level: Secondary | ICD-10-CM | POA: Diagnosis not present

## 2016-09-22 DIAGNOSIS — M5136 Other intervertebral disc degeneration, lumbar region: Secondary | ICD-10-CM | POA: Diagnosis not present

## 2016-09-22 DIAGNOSIS — M9903 Segmental and somatic dysfunction of lumbar region: Secondary | ICD-10-CM | POA: Diagnosis not present

## 2016-09-22 DIAGNOSIS — S39012A Strain of muscle, fascia and tendon of lower back, initial encounter: Secondary | ICD-10-CM | POA: Diagnosis not present

## 2016-09-22 DIAGNOSIS — M9902 Segmental and somatic dysfunction of thoracic region: Secondary | ICD-10-CM | POA: Diagnosis not present

## 2016-09-22 DIAGNOSIS — M542 Cervicalgia: Secondary | ICD-10-CM | POA: Diagnosis not present

## 2016-09-26 DIAGNOSIS — M9903 Segmental and somatic dysfunction of lumbar region: Secondary | ICD-10-CM | POA: Diagnosis not present

## 2016-09-26 DIAGNOSIS — M542 Cervicalgia: Secondary | ICD-10-CM | POA: Diagnosis not present

## 2016-09-26 DIAGNOSIS — S39012A Strain of muscle, fascia and tendon of lower back, initial encounter: Secondary | ICD-10-CM | POA: Diagnosis not present

## 2016-09-26 DIAGNOSIS — M5032 Other cervical disc degeneration, mid-cervical region, unspecified level: Secondary | ICD-10-CM | POA: Diagnosis not present

## 2016-09-26 DIAGNOSIS — M9902 Segmental and somatic dysfunction of thoracic region: Secondary | ICD-10-CM | POA: Diagnosis not present

## 2016-09-26 DIAGNOSIS — S29019A Strain of muscle and tendon of unspecified wall of thorax, initial encounter: Secondary | ICD-10-CM | POA: Diagnosis not present

## 2016-09-26 DIAGNOSIS — M9901 Segmental and somatic dysfunction of cervical region: Secondary | ICD-10-CM | POA: Diagnosis not present

## 2016-09-26 DIAGNOSIS — M5136 Other intervertebral disc degeneration, lumbar region: Secondary | ICD-10-CM | POA: Diagnosis not present

## 2016-09-27 DIAGNOSIS — M542 Cervicalgia: Secondary | ICD-10-CM | POA: Diagnosis not present

## 2016-09-27 DIAGNOSIS — M9901 Segmental and somatic dysfunction of cervical region: Secondary | ICD-10-CM | POA: Diagnosis not present

## 2016-09-27 DIAGNOSIS — M9903 Segmental and somatic dysfunction of lumbar region: Secondary | ICD-10-CM | POA: Diagnosis not present

## 2016-09-27 DIAGNOSIS — M5032 Other cervical disc degeneration, mid-cervical region, unspecified level: Secondary | ICD-10-CM | POA: Diagnosis not present

## 2016-09-27 DIAGNOSIS — M9902 Segmental and somatic dysfunction of thoracic region: Secondary | ICD-10-CM | POA: Diagnosis not present

## 2016-09-27 DIAGNOSIS — S29019A Strain of muscle and tendon of unspecified wall of thorax, initial encounter: Secondary | ICD-10-CM | POA: Diagnosis not present

## 2016-09-27 DIAGNOSIS — S39012A Strain of muscle, fascia and tendon of lower back, initial encounter: Secondary | ICD-10-CM | POA: Diagnosis not present

## 2016-09-27 DIAGNOSIS — M5136 Other intervertebral disc degeneration, lumbar region: Secondary | ICD-10-CM | POA: Diagnosis not present

## 2016-09-30 DIAGNOSIS — M542 Cervicalgia: Secondary | ICD-10-CM | POA: Diagnosis not present

## 2016-09-30 DIAGNOSIS — M5032 Other cervical disc degeneration, mid-cervical region, unspecified level: Secondary | ICD-10-CM | POA: Diagnosis not present

## 2016-09-30 DIAGNOSIS — M9903 Segmental and somatic dysfunction of lumbar region: Secondary | ICD-10-CM | POA: Diagnosis not present

## 2016-09-30 DIAGNOSIS — S29019A Strain of muscle and tendon of unspecified wall of thorax, initial encounter: Secondary | ICD-10-CM | POA: Diagnosis not present

## 2016-09-30 DIAGNOSIS — S39012A Strain of muscle, fascia and tendon of lower back, initial encounter: Secondary | ICD-10-CM | POA: Diagnosis not present

## 2016-09-30 DIAGNOSIS — M5136 Other intervertebral disc degeneration, lumbar region: Secondary | ICD-10-CM | POA: Diagnosis not present

## 2016-09-30 DIAGNOSIS — M9902 Segmental and somatic dysfunction of thoracic region: Secondary | ICD-10-CM | POA: Diagnosis not present

## 2016-09-30 DIAGNOSIS — M9901 Segmental and somatic dysfunction of cervical region: Secondary | ICD-10-CM | POA: Diagnosis not present

## 2016-10-04 DIAGNOSIS — M542 Cervicalgia: Secondary | ICD-10-CM | POA: Diagnosis not present

## 2016-10-04 DIAGNOSIS — M9903 Segmental and somatic dysfunction of lumbar region: Secondary | ICD-10-CM | POA: Diagnosis not present

## 2016-10-04 DIAGNOSIS — S29019A Strain of muscle and tendon of unspecified wall of thorax, initial encounter: Secondary | ICD-10-CM | POA: Diagnosis not present

## 2016-10-04 DIAGNOSIS — M5032 Other cervical disc degeneration, mid-cervical region, unspecified level: Secondary | ICD-10-CM | POA: Diagnosis not present

## 2016-10-04 DIAGNOSIS — S39012A Strain of muscle, fascia and tendon of lower back, initial encounter: Secondary | ICD-10-CM | POA: Diagnosis not present

## 2016-10-04 DIAGNOSIS — M5136 Other intervertebral disc degeneration, lumbar region: Secondary | ICD-10-CM | POA: Diagnosis not present

## 2016-10-04 DIAGNOSIS — M9902 Segmental and somatic dysfunction of thoracic region: Secondary | ICD-10-CM | POA: Diagnosis not present

## 2016-10-04 DIAGNOSIS — M9901 Segmental and somatic dysfunction of cervical region: Secondary | ICD-10-CM | POA: Diagnosis not present

## 2016-10-06 DIAGNOSIS — M9901 Segmental and somatic dysfunction of cervical region: Secondary | ICD-10-CM | POA: Diagnosis not present

## 2016-10-06 DIAGNOSIS — M5136 Other intervertebral disc degeneration, lumbar region: Secondary | ICD-10-CM | POA: Diagnosis not present

## 2016-10-06 DIAGNOSIS — S29019A Strain of muscle and tendon of unspecified wall of thorax, initial encounter: Secondary | ICD-10-CM | POA: Diagnosis not present

## 2016-10-06 DIAGNOSIS — M9903 Segmental and somatic dysfunction of lumbar region: Secondary | ICD-10-CM | POA: Diagnosis not present

## 2016-10-06 DIAGNOSIS — S39012A Strain of muscle, fascia and tendon of lower back, initial encounter: Secondary | ICD-10-CM | POA: Diagnosis not present

## 2016-10-06 DIAGNOSIS — M5032 Other cervical disc degeneration, mid-cervical region, unspecified level: Secondary | ICD-10-CM | POA: Diagnosis not present

## 2016-10-06 DIAGNOSIS — M542 Cervicalgia: Secondary | ICD-10-CM | POA: Diagnosis not present

## 2016-10-06 DIAGNOSIS — M9902 Segmental and somatic dysfunction of thoracic region: Secondary | ICD-10-CM | POA: Diagnosis not present

## 2016-10-06 DIAGNOSIS — F3174 Bipolar disorder, in full remission, most recent episode manic: Secondary | ICD-10-CM | POA: Diagnosis not present

## 2016-10-10 DIAGNOSIS — M9903 Segmental and somatic dysfunction of lumbar region: Secondary | ICD-10-CM | POA: Diagnosis not present

## 2016-10-10 DIAGNOSIS — M5136 Other intervertebral disc degeneration, lumbar region: Secondary | ICD-10-CM | POA: Diagnosis not present

## 2016-10-10 DIAGNOSIS — S39012A Strain of muscle, fascia and tendon of lower back, initial encounter: Secondary | ICD-10-CM | POA: Diagnosis not present

## 2016-10-10 DIAGNOSIS — S29019A Strain of muscle and tendon of unspecified wall of thorax, initial encounter: Secondary | ICD-10-CM | POA: Diagnosis not present

## 2016-10-10 DIAGNOSIS — M542 Cervicalgia: Secondary | ICD-10-CM | POA: Diagnosis not present

## 2016-10-10 DIAGNOSIS — M5032 Other cervical disc degeneration, mid-cervical region, unspecified level: Secondary | ICD-10-CM | POA: Diagnosis not present

## 2016-10-10 DIAGNOSIS — M9901 Segmental and somatic dysfunction of cervical region: Secondary | ICD-10-CM | POA: Diagnosis not present

## 2016-10-10 DIAGNOSIS — M9902 Segmental and somatic dysfunction of thoracic region: Secondary | ICD-10-CM | POA: Diagnosis not present

## 2016-10-20 DIAGNOSIS — M9901 Segmental and somatic dysfunction of cervical region: Secondary | ICD-10-CM | POA: Diagnosis not present

## 2016-10-20 DIAGNOSIS — M9902 Segmental and somatic dysfunction of thoracic region: Secondary | ICD-10-CM | POA: Diagnosis not present

## 2016-10-20 DIAGNOSIS — M9903 Segmental and somatic dysfunction of lumbar region: Secondary | ICD-10-CM | POA: Diagnosis not present

## 2016-10-20 DIAGNOSIS — S29019A Strain of muscle and tendon of unspecified wall of thorax, initial encounter: Secondary | ICD-10-CM | POA: Diagnosis not present

## 2016-10-20 DIAGNOSIS — M5032 Other cervical disc degeneration, mid-cervical region, unspecified level: Secondary | ICD-10-CM | POA: Diagnosis not present

## 2016-10-20 DIAGNOSIS — M5136 Other intervertebral disc degeneration, lumbar region: Secondary | ICD-10-CM | POA: Diagnosis not present

## 2016-10-20 DIAGNOSIS — M542 Cervicalgia: Secondary | ICD-10-CM | POA: Diagnosis not present

## 2016-10-20 DIAGNOSIS — S39012A Strain of muscle, fascia and tendon of lower back, initial encounter: Secondary | ICD-10-CM | POA: Diagnosis not present

## 2016-10-26 DIAGNOSIS — S29019A Strain of muscle and tendon of unspecified wall of thorax, initial encounter: Secondary | ICD-10-CM | POA: Diagnosis not present

## 2016-10-26 DIAGNOSIS — M5032 Other cervical disc degeneration, mid-cervical region, unspecified level: Secondary | ICD-10-CM | POA: Diagnosis not present

## 2016-10-26 DIAGNOSIS — R5383 Other fatigue: Secondary | ICD-10-CM | POA: Diagnosis not present

## 2016-10-26 DIAGNOSIS — S39012A Strain of muscle, fascia and tendon of lower back, initial encounter: Secondary | ICD-10-CM | POA: Diagnosis not present

## 2016-10-26 DIAGNOSIS — M9902 Segmental and somatic dysfunction of thoracic region: Secondary | ICD-10-CM | POA: Diagnosis not present

## 2016-10-26 DIAGNOSIS — M9903 Segmental and somatic dysfunction of lumbar region: Secondary | ICD-10-CM | POA: Diagnosis not present

## 2016-10-26 DIAGNOSIS — R51 Headache: Secondary | ICD-10-CM | POA: Diagnosis not present

## 2016-10-26 DIAGNOSIS — M9901 Segmental and somatic dysfunction of cervical region: Secondary | ICD-10-CM | POA: Diagnosis not present

## 2016-10-26 DIAGNOSIS — R922 Inconclusive mammogram: Secondary | ICD-10-CM | POA: Diagnosis not present

## 2016-10-26 DIAGNOSIS — M542 Cervicalgia: Secondary | ICD-10-CM | POA: Diagnosis not present

## 2016-10-26 DIAGNOSIS — G44209 Tension-type headache, unspecified, not intractable: Secondary | ICD-10-CM | POA: Diagnosis not present

## 2016-10-26 DIAGNOSIS — M5136 Other intervertebral disc degeneration, lumbar region: Secondary | ICD-10-CM | POA: Diagnosis not present

## 2016-11-02 DIAGNOSIS — M5136 Other intervertebral disc degeneration, lumbar region: Secondary | ICD-10-CM | POA: Diagnosis not present

## 2016-11-02 DIAGNOSIS — S29019A Strain of muscle and tendon of unspecified wall of thorax, initial encounter: Secondary | ICD-10-CM | POA: Diagnosis not present

## 2016-11-02 DIAGNOSIS — M9903 Segmental and somatic dysfunction of lumbar region: Secondary | ICD-10-CM | POA: Diagnosis not present

## 2016-11-02 DIAGNOSIS — M542 Cervicalgia: Secondary | ICD-10-CM | POA: Diagnosis not present

## 2016-11-02 DIAGNOSIS — M5032 Other cervical disc degeneration, mid-cervical region, unspecified level: Secondary | ICD-10-CM | POA: Diagnosis not present

## 2016-11-02 DIAGNOSIS — M9902 Segmental and somatic dysfunction of thoracic region: Secondary | ICD-10-CM | POA: Diagnosis not present

## 2016-11-02 DIAGNOSIS — M9901 Segmental and somatic dysfunction of cervical region: Secondary | ICD-10-CM | POA: Diagnosis not present

## 2016-11-02 DIAGNOSIS — S39012A Strain of muscle, fascia and tendon of lower back, initial encounter: Secondary | ICD-10-CM | POA: Diagnosis not present

## 2016-11-07 DIAGNOSIS — R51 Headache: Secondary | ICD-10-CM | POA: Diagnosis not present

## 2016-11-07 DIAGNOSIS — R42 Dizziness and giddiness: Secondary | ICD-10-CM | POA: Diagnosis not present

## 2016-11-16 DIAGNOSIS — N6002 Solitary cyst of left breast: Secondary | ICD-10-CM | POA: Diagnosis not present

## 2016-11-16 DIAGNOSIS — R922 Inconclusive mammogram: Secondary | ICD-10-CM | POA: Diagnosis not present

## 2016-11-16 DIAGNOSIS — N6011 Diffuse cystic mastopathy of right breast: Secondary | ICD-10-CM | POA: Diagnosis not present

## 2016-11-16 DIAGNOSIS — N6001 Solitary cyst of right breast: Secondary | ICD-10-CM | POA: Diagnosis not present

## 2016-11-17 DIAGNOSIS — M9901 Segmental and somatic dysfunction of cervical region: Secondary | ICD-10-CM | POA: Diagnosis not present

## 2016-11-17 DIAGNOSIS — M5032 Other cervical disc degeneration, mid-cervical region, unspecified level: Secondary | ICD-10-CM | POA: Diagnosis not present

## 2016-11-17 DIAGNOSIS — M9902 Segmental and somatic dysfunction of thoracic region: Secondary | ICD-10-CM | POA: Diagnosis not present

## 2016-11-17 DIAGNOSIS — M5136 Other intervertebral disc degeneration, lumbar region: Secondary | ICD-10-CM | POA: Diagnosis not present

## 2016-11-17 DIAGNOSIS — S39012A Strain of muscle, fascia and tendon of lower back, initial encounter: Secondary | ICD-10-CM | POA: Diagnosis not present

## 2016-11-17 DIAGNOSIS — S29019A Strain of muscle and tendon of unspecified wall of thorax, initial encounter: Secondary | ICD-10-CM | POA: Diagnosis not present

## 2016-11-17 DIAGNOSIS — M542 Cervicalgia: Secondary | ICD-10-CM | POA: Diagnosis not present

## 2016-11-17 DIAGNOSIS — M9903 Segmental and somatic dysfunction of lumbar region: Secondary | ICD-10-CM | POA: Diagnosis not present

## 2016-11-25 DIAGNOSIS — F39 Unspecified mood [affective] disorder: Secondary | ICD-10-CM | POA: Diagnosis not present

## 2016-11-25 DIAGNOSIS — F4312 Post-traumatic stress disorder, chronic: Secondary | ICD-10-CM | POA: Diagnosis not present

## 2016-11-25 DIAGNOSIS — F411 Generalized anxiety disorder: Secondary | ICD-10-CM | POA: Diagnosis not present

## 2016-11-25 DIAGNOSIS — F4011 Social phobia, generalized: Secondary | ICD-10-CM | POA: Diagnosis not present

## 2016-11-25 DIAGNOSIS — F5105 Insomnia due to other mental disorder: Secondary | ICD-10-CM | POA: Diagnosis not present

## 2016-12-06 DIAGNOSIS — M9901 Segmental and somatic dysfunction of cervical region: Secondary | ICD-10-CM | POA: Diagnosis not present

## 2016-12-06 DIAGNOSIS — M5136 Other intervertebral disc degeneration, lumbar region: Secondary | ICD-10-CM | POA: Diagnosis not present

## 2016-12-06 DIAGNOSIS — M5032 Other cervical disc degeneration, mid-cervical region, unspecified level: Secondary | ICD-10-CM | POA: Diagnosis not present

## 2016-12-06 DIAGNOSIS — M9902 Segmental and somatic dysfunction of thoracic region: Secondary | ICD-10-CM | POA: Diagnosis not present

## 2016-12-06 DIAGNOSIS — M9903 Segmental and somatic dysfunction of lumbar region: Secondary | ICD-10-CM | POA: Diagnosis not present

## 2016-12-08 DIAGNOSIS — Z049 Encounter for examination and observation for unspecified reason: Secondary | ICD-10-CM | POA: Diagnosis not present

## 2016-12-08 DIAGNOSIS — G43719 Chronic migraine without aura, intractable, without status migrainosus: Secondary | ICD-10-CM | POA: Diagnosis not present

## 2016-12-08 DIAGNOSIS — G43839 Menstrual migraine, intractable, without status migrainosus: Secondary | ICD-10-CM | POA: Diagnosis not present

## 2016-12-12 DIAGNOSIS — F39 Unspecified mood [affective] disorder: Secondary | ICD-10-CM | POA: Diagnosis not present

## 2016-12-12 DIAGNOSIS — F4312 Post-traumatic stress disorder, chronic: Secondary | ICD-10-CM | POA: Diagnosis not present

## 2016-12-12 DIAGNOSIS — F4011 Social phobia, generalized: Secondary | ICD-10-CM | POA: Diagnosis not present

## 2016-12-12 DIAGNOSIS — F5105 Insomnia due to other mental disorder: Secondary | ICD-10-CM | POA: Diagnosis not present

## 2016-12-12 DIAGNOSIS — F411 Generalized anxiety disorder: Secondary | ICD-10-CM | POA: Diagnosis not present

## 2016-12-15 DIAGNOSIS — F411 Generalized anxiety disorder: Secondary | ICD-10-CM | POA: Diagnosis not present

## 2016-12-28 DIAGNOSIS — F39 Unspecified mood [affective] disorder: Secondary | ICD-10-CM | POA: Diagnosis not present

## 2017-01-02 DIAGNOSIS — F4011 Social phobia, generalized: Secondary | ICD-10-CM | POA: Diagnosis not present

## 2017-01-02 DIAGNOSIS — F5105 Insomnia due to other mental disorder: Secondary | ICD-10-CM | POA: Diagnosis not present

## 2017-01-02 DIAGNOSIS — F411 Generalized anxiety disorder: Secondary | ICD-10-CM | POA: Diagnosis not present

## 2017-01-02 DIAGNOSIS — F39 Unspecified mood [affective] disorder: Secondary | ICD-10-CM | POA: Diagnosis not present

## 2017-01-02 DIAGNOSIS — F4312 Post-traumatic stress disorder, chronic: Secondary | ICD-10-CM | POA: Diagnosis not present

## 2017-01-18 DIAGNOSIS — F4011 Social phobia, generalized: Secondary | ICD-10-CM | POA: Diagnosis not present

## 2017-01-18 DIAGNOSIS — F39 Unspecified mood [affective] disorder: Secondary | ICD-10-CM | POA: Diagnosis not present

## 2017-01-18 DIAGNOSIS — F5105 Insomnia due to other mental disorder: Secondary | ICD-10-CM | POA: Diagnosis not present

## 2017-01-18 DIAGNOSIS — F4312 Post-traumatic stress disorder, chronic: Secondary | ICD-10-CM | POA: Diagnosis not present

## 2017-01-18 DIAGNOSIS — F411 Generalized anxiety disorder: Secondary | ICD-10-CM | POA: Diagnosis not present

## 2017-02-06 DIAGNOSIS — F322 Major depressive disorder, single episode, severe without psychotic features: Secondary | ICD-10-CM | POA: Diagnosis not present

## 2017-02-14 DIAGNOSIS — F322 Major depressive disorder, single episode, severe without psychotic features: Secondary | ICD-10-CM | POA: Diagnosis not present

## 2017-02-23 DIAGNOSIS — F4312 Post-traumatic stress disorder, chronic: Secondary | ICD-10-CM | POA: Diagnosis not present

## 2017-02-23 DIAGNOSIS — F5105 Insomnia due to other mental disorder: Secondary | ICD-10-CM | POA: Diagnosis not present

## 2017-02-23 DIAGNOSIS — F39 Unspecified mood [affective] disorder: Secondary | ICD-10-CM | POA: Diagnosis not present

## 2017-02-23 DIAGNOSIS — F4011 Social phobia, generalized: Secondary | ICD-10-CM | POA: Diagnosis not present

## 2017-02-23 DIAGNOSIS — F411 Generalized anxiety disorder: Secondary | ICD-10-CM | POA: Diagnosis not present

## 2017-02-27 DIAGNOSIS — F322 Major depressive disorder, single episode, severe without psychotic features: Secondary | ICD-10-CM | POA: Diagnosis not present

## 2017-03-15 DIAGNOSIS — F5105 Insomnia due to other mental disorder: Secondary | ICD-10-CM | POA: Diagnosis not present

## 2017-03-15 DIAGNOSIS — F322 Major depressive disorder, single episode, severe without psychotic features: Secondary | ICD-10-CM | POA: Diagnosis not present

## 2017-03-15 DIAGNOSIS — F4011 Social phobia, generalized: Secondary | ICD-10-CM | POA: Diagnosis not present

## 2017-03-15 DIAGNOSIS — F411 Generalized anxiety disorder: Secondary | ICD-10-CM | POA: Diagnosis not present

## 2017-03-15 DIAGNOSIS — F4312 Post-traumatic stress disorder, chronic: Secondary | ICD-10-CM | POA: Diagnosis not present

## 2017-03-15 DIAGNOSIS — F39 Unspecified mood [affective] disorder: Secondary | ICD-10-CM | POA: Diagnosis not present

## 2017-03-17 DIAGNOSIS — N6310 Unspecified lump in the right breast, unspecified quadrant: Secondary | ICD-10-CM | POA: Diagnosis not present

## 2017-03-17 DIAGNOSIS — Z6824 Body mass index (BMI) 24.0-24.9, adult: Secondary | ICD-10-CM | POA: Diagnosis not present

## 2017-03-30 DIAGNOSIS — F39 Unspecified mood [affective] disorder: Secondary | ICD-10-CM | POA: Diagnosis not present

## 2017-03-30 DIAGNOSIS — F4011 Social phobia, generalized: Secondary | ICD-10-CM | POA: Diagnosis not present

## 2017-03-30 DIAGNOSIS — F4312 Post-traumatic stress disorder, chronic: Secondary | ICD-10-CM | POA: Diagnosis not present

## 2017-03-30 DIAGNOSIS — F5105 Insomnia due to other mental disorder: Secondary | ICD-10-CM | POA: Diagnosis not present

## 2017-03-30 DIAGNOSIS — F411 Generalized anxiety disorder: Secondary | ICD-10-CM | POA: Diagnosis not present

## 2017-04-05 DIAGNOSIS — Z0000 Encounter for general adult medical examination without abnormal findings: Secondary | ICD-10-CM | POA: Diagnosis not present

## 2017-04-05 DIAGNOSIS — Z6836 Body mass index (BMI) 36.0-36.9, adult: Secondary | ICD-10-CM | POA: Diagnosis not present

## 2017-04-05 DIAGNOSIS — Z01419 Encounter for gynecological examination (general) (routine) without abnormal findings: Secondary | ICD-10-CM | POA: Diagnosis not present

## 2017-04-05 DIAGNOSIS — Z23 Encounter for immunization: Secondary | ICD-10-CM | POA: Diagnosis not present

## 2017-04-06 DIAGNOSIS — F39 Unspecified mood [affective] disorder: Secondary | ICD-10-CM | POA: Diagnosis not present

## 2017-04-06 DIAGNOSIS — Z01419 Encounter for gynecological examination (general) (routine) without abnormal findings: Secondary | ICD-10-CM | POA: Diagnosis not present

## 2017-04-06 DIAGNOSIS — Z79899 Other long term (current) drug therapy: Secondary | ICD-10-CM | POA: Diagnosis not present

## 2017-04-08 DIAGNOSIS — R109 Unspecified abdominal pain: Secondary | ICD-10-CM | POA: Diagnosis not present

## 2017-04-08 DIAGNOSIS — K439 Ventral hernia without obstruction or gangrene: Secondary | ICD-10-CM | POA: Diagnosis not present

## 2017-04-11 DIAGNOSIS — F322 Major depressive disorder, single episode, severe without psychotic features: Secondary | ICD-10-CM | POA: Diagnosis not present

## 2017-04-24 DIAGNOSIS — F39 Unspecified mood [affective] disorder: Secondary | ICD-10-CM | POA: Diagnosis not present

## 2017-04-24 DIAGNOSIS — F5105 Insomnia due to other mental disorder: Secondary | ICD-10-CM | POA: Diagnosis not present

## 2017-04-24 DIAGNOSIS — F4011 Social phobia, generalized: Secondary | ICD-10-CM | POA: Diagnosis not present

## 2017-04-24 DIAGNOSIS — F4312 Post-traumatic stress disorder, chronic: Secondary | ICD-10-CM | POA: Diagnosis not present

## 2017-04-24 DIAGNOSIS — F411 Generalized anxiety disorder: Secondary | ICD-10-CM | POA: Diagnosis not present

## 2017-05-11 DIAGNOSIS — F411 Generalized anxiety disorder: Secondary | ICD-10-CM | POA: Diagnosis not present

## 2017-05-24 DIAGNOSIS — F4011 Social phobia, generalized: Secondary | ICD-10-CM | POA: Diagnosis not present

## 2017-05-24 DIAGNOSIS — F5105 Insomnia due to other mental disorder: Secondary | ICD-10-CM | POA: Diagnosis not present

## 2017-05-24 DIAGNOSIS — F411 Generalized anxiety disorder: Secondary | ICD-10-CM | POA: Diagnosis not present

## 2017-05-24 DIAGNOSIS — F39 Unspecified mood [affective] disorder: Secondary | ICD-10-CM | POA: Diagnosis not present

## 2017-05-24 DIAGNOSIS — F4312 Post-traumatic stress disorder, chronic: Secondary | ICD-10-CM | POA: Diagnosis not present

## 2017-06-16 DIAGNOSIS — F322 Major depressive disorder, single episode, severe without psychotic features: Secondary | ICD-10-CM | POA: Diagnosis not present

## 2017-06-19 DIAGNOSIS — F901 Attention-deficit hyperactivity disorder, predominantly hyperactive type: Secondary | ICD-10-CM | POA: Diagnosis not present

## 2017-07-04 DIAGNOSIS — F322 Major depressive disorder, single episode, severe without psychotic features: Secondary | ICD-10-CM | POA: Diagnosis not present

## 2017-08-11 IMAGING — CT CT ABD-PELV W/ CM
2 of 5 series · 16 of 46 positions shown, 18 images · IV contrast (omnipaque)
Comparison: 06/02/2015

CLINICAL DATA: Periumbilical pain and swelling with discoloration.
Umbilical hernia.

EXAM:
CT ABDOMEN AND PELVIS WITH CONTRAST
TECHNIQUE: Multidetector CT imaging of the abdomen and pelvis was performed
using the standard protocol following bolus administration of
intravenous contrast.
CONTRAST:  100mL OMNIPAQUE IOHEXOL 350 MG/ML SOLN

[Series 2: routine abd pel with · axial · 0.74mm/px · z∈[-492,-57]mm · 13 of 99 slices shown, 15 images]
[im 6/99  soft-tissue]
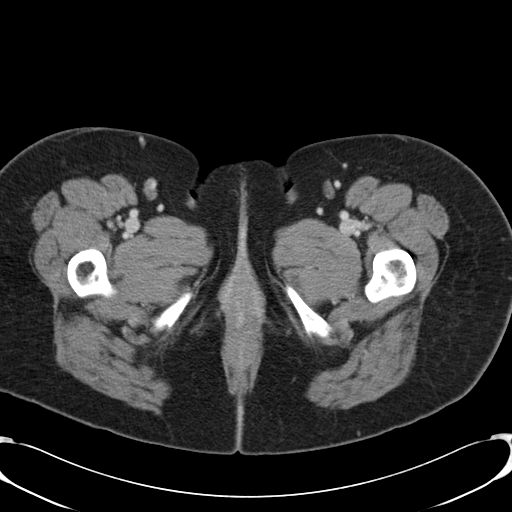
[im 6/99  bone]
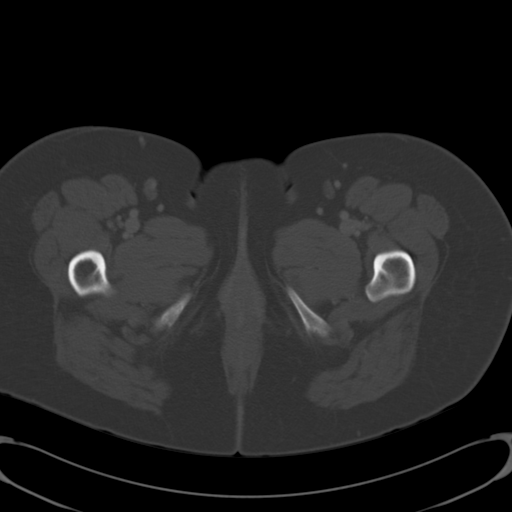
[im 11/99  soft-tissue]
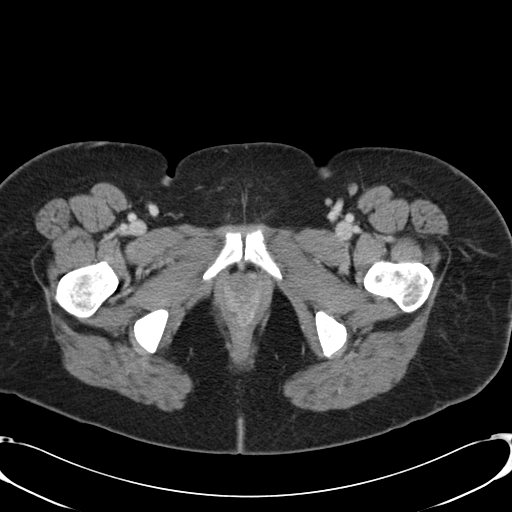
[im 22/99  soft-tissue]
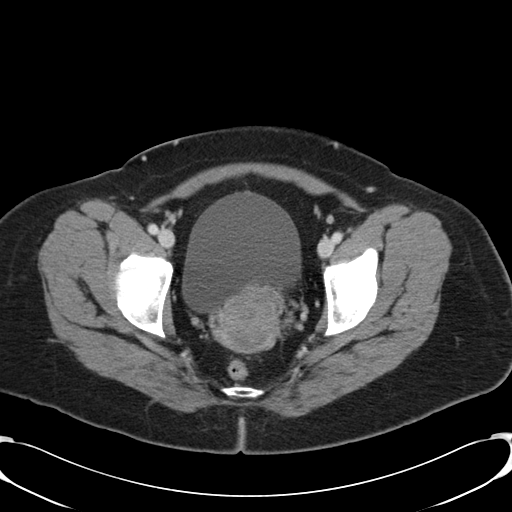
[im 28/99  soft-tissue]
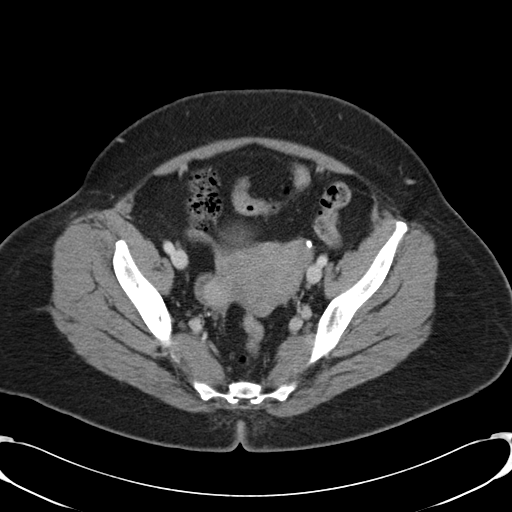
[im 33/99  soft-tissue]
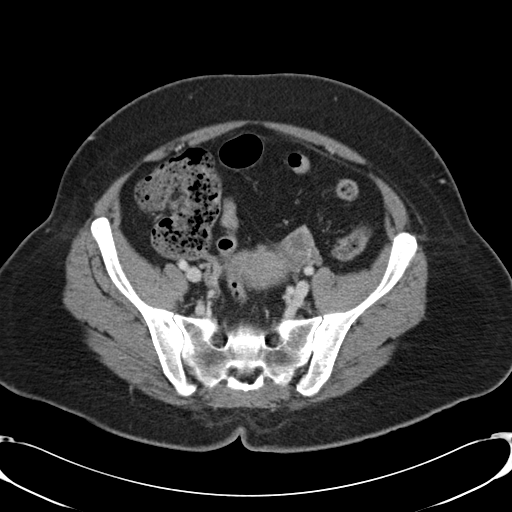
[im 44/99  soft-tissue]
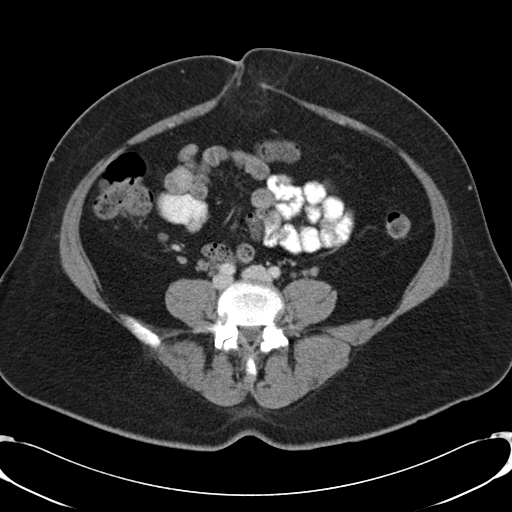
[im 50/99  soft-tissue]
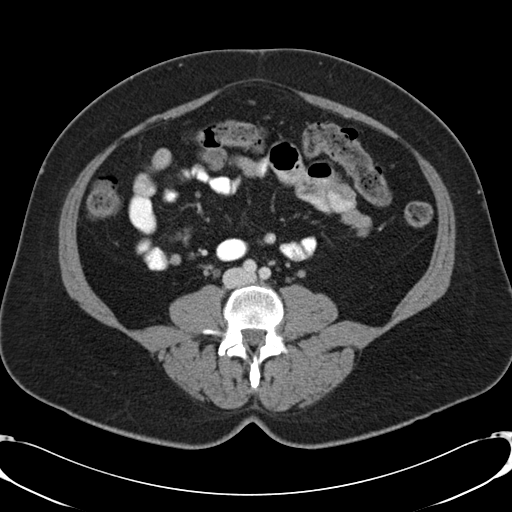
[im 55/99  soft-tissue]
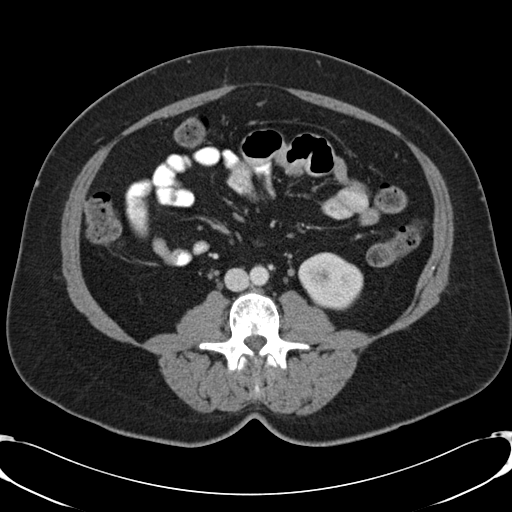
[im 66/99  soft-tissue]
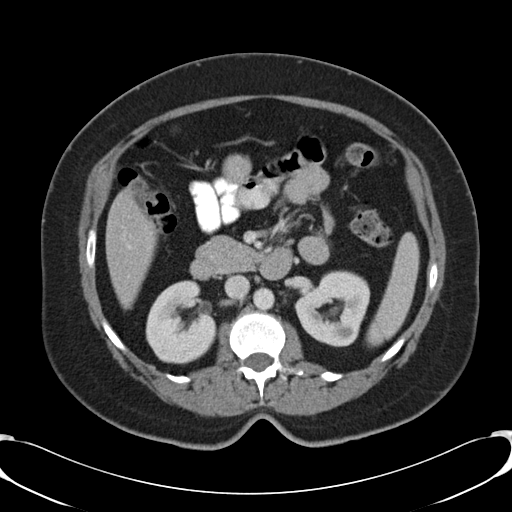
[im 66/99  bone]
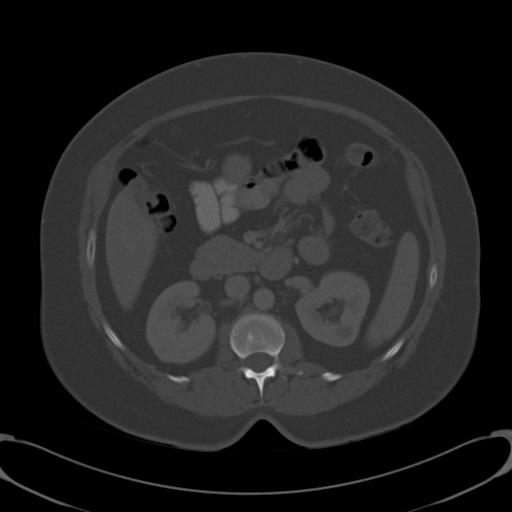
[im 71/99  soft-tissue]
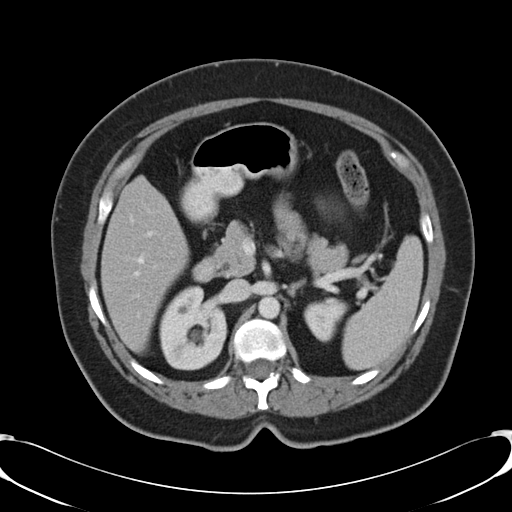
[im 77/99  soft-tissue]
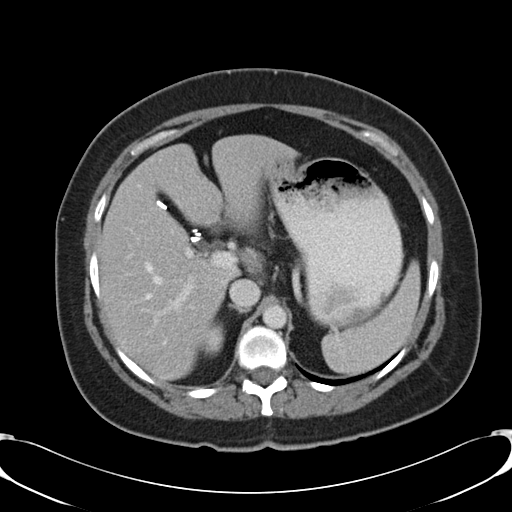
[im 88/99  soft-tissue]
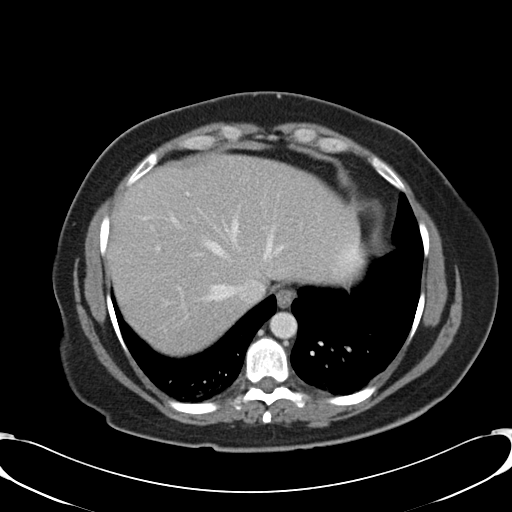
[im 93/99  soft-tissue]
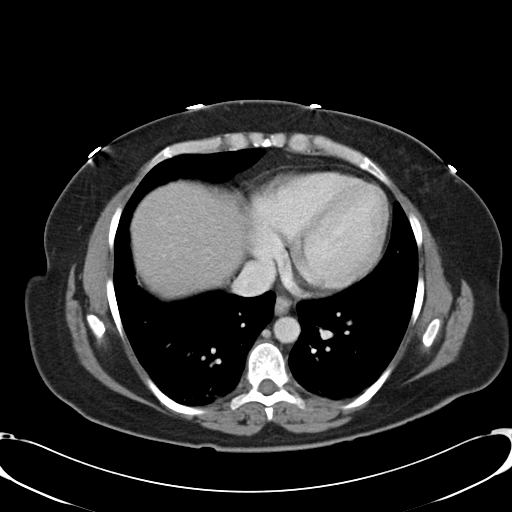

[Series 5: cor routine abd pel with · coronal · 0.65mm/px · 3 of 155 slices shown]
[im 52/155  soft-tissue]
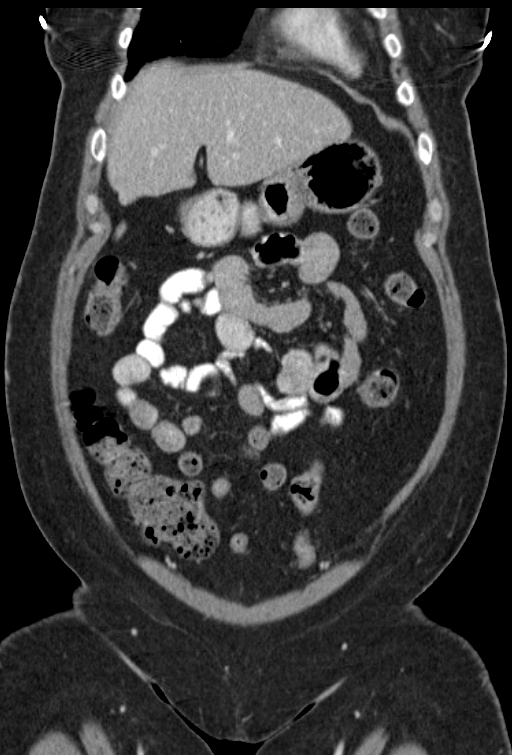
[im 69/155  soft-tissue]
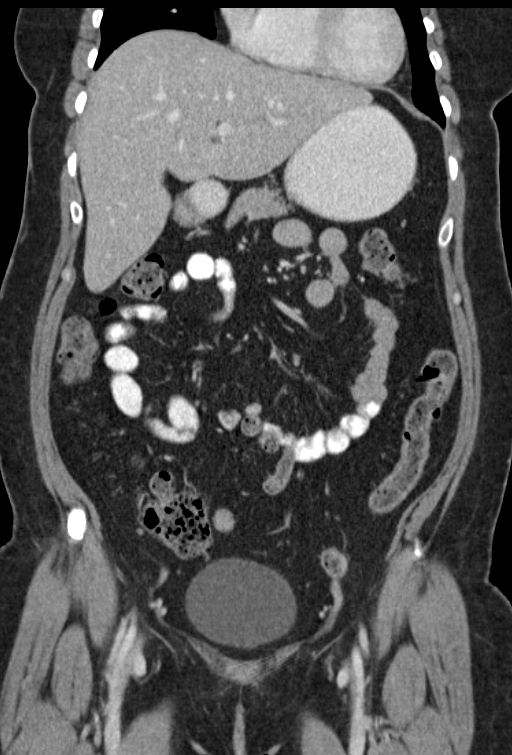
[im 86/155  soft-tissue]
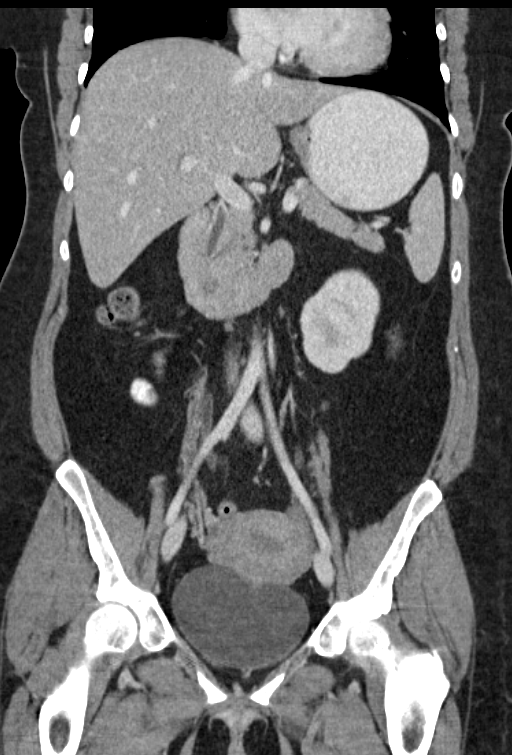

[16 of 46 positions shown; findings below may reference images not displayed]

FINDINGS: Dependent subsegmental atelectasis is noted in the lung bases.

The gallbladder is surgically absent. No significant biliary
dilatation is seen. Mild hepatic steatosis is noted with focal fat
adjacent to the falciform ligament. The spleen, adrenal glands, and
pancreas are unremarkable.

Multiple small hypodense lesions measuring up to 1 cm in size are
again seen in both kidneys, too small to fully characterize although
most likely representing cysts. A 5 mm angiomyolipoma is again noted
in the upper pole of the left kidney. A punctate nonobstructing
calculus is again noted in the lower pole of the right kidney.

Oral contrast is present in nondilated loops of mid small bowel.
There is no evidence of bowel obstruction or inflammation. The
appendix is unremarkable. An umbilical hernia containing only fat
has mildly increased in size with minimal haziness of the herniated
fat.

Uterus and ovaries are present. Bilateral tubal ligation clips are
noted. Bladder is unremarkable. No acute osseous abnormality is
seen.
IMPRESSION: 1. Mildly increased size of fat-containing umbilical hernia.
2. Tiny nonobstructing right renal calculus.

## 2018-03-26 ENCOUNTER — Encounter: Payer: Self-pay | Admitting: Emergency Medicine

## 2018-04-01 ENCOUNTER — Inpatient Hospital Stay (HOSPITAL_COMMUNITY)
Admission: AD | Admit: 2018-04-01 | Discharge: 2018-04-03 | DRG: 885 | Disposition: A | Discharge status: Home / Self Care | Payer: BLUE CROSS/BLUE SHIELD | Source: Intra-hospital | Attending: Psychiatry | Admitting: Psychiatry

## 2018-04-01 ENCOUNTER — Other Ambulatory Visit: Payer: Self-pay

## 2018-04-01 ENCOUNTER — Encounter (HOSPITAL_COMMUNITY): Payer: Self-pay | Admitting: Emergency Medicine

## 2018-04-01 DIAGNOSIS — F419 Anxiety disorder, unspecified: Secondary | ICD-10-CM

## 2018-04-01 DIAGNOSIS — F411 Generalized anxiety disorder: Secondary | ICD-10-CM | POA: Diagnosis present

## 2018-04-01 DIAGNOSIS — G4700 Insomnia, unspecified: Secondary | ICD-10-CM | POA: Diagnosis present

## 2018-04-01 DIAGNOSIS — F332 Major depressive disorder, recurrent severe without psychotic features: Principal | ICD-10-CM

## 2018-04-01 DIAGNOSIS — F339 Major depressive disorder, recurrent, unspecified: Secondary | ICD-10-CM | POA: Diagnosis present

## 2018-04-01 DIAGNOSIS — Z23 Encounter for immunization: Secondary | ICD-10-CM

## 2018-04-01 DIAGNOSIS — I10 Essential (primary) hypertension: Secondary | ICD-10-CM | POA: Diagnosis present

## 2018-04-01 DIAGNOSIS — F17290 Nicotine dependence, other tobacco product, uncomplicated: Secondary | ICD-10-CM | POA: Diagnosis present

## 2018-04-01 DIAGNOSIS — Z79899 Other long term (current) drug therapy: Secondary | ICD-10-CM | POA: Diagnosis not present

## 2018-04-01 DIAGNOSIS — F410 Panic disorder [episodic paroxysmal anxiety] without agoraphobia: Secondary | ICD-10-CM | POA: Diagnosis present

## 2018-04-01 DIAGNOSIS — Z818 Family history of other mental and behavioral disorders: Secondary | ICD-10-CM | POA: Diagnosis not present

## 2018-04-01 DIAGNOSIS — R45851 Suicidal ideations: Secondary | ICD-10-CM | POA: Diagnosis present

## 2018-04-01 DIAGNOSIS — F322 Major depressive disorder, single episode, severe without psychotic features: Secondary | ICD-10-CM

## 2018-04-01 DIAGNOSIS — Z915 Personal history of self-harm: Secondary | ICD-10-CM | POA: Diagnosis not present

## 2018-04-01 MED ORDER — HYDROXYZINE HCL 25 MG PO TABS
25.0000 mg | ORAL_TABLET | Freq: Four times a day (QID) | ORAL | Status: DC | PRN
  Administered 2018-04-01 – 2018-04-03 (×4): 25 mg via ORAL
  Filled 2018-04-01 (×5): qty 1

## 2018-04-01 MED ORDER — MIRTAZAPINE 7.5 MG PO TABS
7.5000 mg | ORAL_TABLET | Freq: Every day | ORAL | Status: DC
  Administered 2018-04-01 – 2018-04-02 (×2): 7.5 mg via ORAL
  Filled 2018-04-01 (×5): qty 1

## 2018-04-01 MED ORDER — FLUOXETINE HCL 10 MG PO CAPS
10.0000 mg | ORAL_CAPSULE | Freq: Every day | ORAL | Status: DC
  Administered 2018-04-02 – 2018-04-03 (×2): 10 mg via ORAL
  Filled 2018-04-01 (×6): qty 1

## 2018-04-01 MED ORDER — INFLUENZA VAC SPLIT QUAD 0.5 ML IM SUSY
0.5000 mL | PREFILLED_SYRINGE | INTRAMUSCULAR | Status: AC
  Administered 2018-04-02: 0.5 mL via INTRAMUSCULAR
  Filled 2018-04-01: qty 0.5

## 2018-04-01 MED ORDER — PNEUMOCOCCAL VAC POLYVALENT 25 MCG/0.5ML IJ INJ
0.5000 mL | INJECTION | INTRAMUSCULAR | Status: AC
  Administered 2018-04-02: 0.5 mL via INTRAMUSCULAR

## 2018-04-01 MED ORDER — ENSURE ENLIVE PO LIQD
237.0000 mL | Freq: Two times a day (BID) | ORAL | Status: DC
  Administered 2018-04-02 – 2018-04-03 (×2): 237 mL via ORAL
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
## 2018-04-01 NOTE — Progress Notes (Signed)
Patient did not attend the evening speaker [place] meeting. Pt was newly admitted to unit and was asleep during group time.

## 2018-04-01 NOTE — Progress Notes (Signed)
Pt is 42 y/o, diagnosed with MDD. Pt IVC 'd and admitted to [facility] today. Pt was tearful with flat affect and sad/depressed mood during admission. Pt has allergy to Oxycodone.  Pt presented to [facility] ED with c/o chest pains. Work-up was done and cardiac enzymes were negative, and drug screen negative. Pt's cardiac complaint was found to be related to anxiety. During assessment, pt verbalized SI for 3 days with a plan to shoot herself or overdose but did not because she did not want to got to hell. Then she said she did not know if there was a hell. Pt reported that her husband was talking about leaving her, then she found out he was planning to leave on Tuesday, 04/03/18.   Pt's stressors include "everything, my husband leaving out-of-the-blue, my daughter is autistic and has Crohn's disease and has an operation on Thursday." Pt also reported that stressors included "money, missing work, and not knowing how to pay for things."   Pt had a inpatient admission 12 years ago for SI attempt. Pt has a history of depression, anxiety, PTSD, migraines, elevated cholesterol, kidney stones, bladder spasms, two hernial repairs, and gall bladder removal.  Pt ambulatory on unit, and denies any pain or concerns at this time. Pt also denies SI/HI and A/VH. q15 minute checks initiated for safety. Pt is safe on the unit.

## 2018-04-01 NOTE — BHH Counselor (Signed)
Clinical Social Work Note  PSA attempted but patient was sleeping and could not be awakened to participate.  Will continue to follow.  Selmer Dominion, LCSW 04/01/2018, 4:34 PM
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
## 2018-04-01 NOTE — BHH Suicide Risk Assessment (Addendum)
[facility] Admission Suicide Risk Assessment   Nursing information obtained from:  Patient Demographic factors:  Caucasian Current Mental Status:  Self-harm thoughts Loss Factors:  Loss of significant relationship, Financial problems / change in socioeconomic status Historical Factors:  Prior suicide attempts Risk Reduction Factors:  Living with another person, especially a relative, Sense of responsibility to family  Total Time spent with patient: 45 minutes Principal Problem:  MDD Diagnosis:  MDD  Subjective Data:   Continued Clinical Symptoms:    The "Alcohol Use Disorders Identification Test", Guidelines for Use in Primary Care, Second Edition.  World Pharmacologist [facility]). Score between 0-7:  no or low risk or alcohol related problems. Score between 8-15:  moderate risk of alcohol related problems. Score between 16-19:  high risk of alcohol related problems. Score 20 or above:  warrants further diagnostic evaluation for alcohol dependence and treatment.   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  Presented to ED for worsening depression, suicidal ideations, neuro-vegetative symptoms and severe anxiety, in the context of marital stressors. States her husband recently requested separation, told her he was leaving. She reports prior history of depression but states she was doing better before the above. On Luvox and low  dose Lithium but states she does not feel they are helping .   Psychiatric Specialty Exam: Physical Exam  ROS  Blood pressure (!) 128/100, pulse (!) 115, temperature 98.5 F (36.9 C), temperature source Oral, resp. rate 18, height 5\' 2"  (1.575 m), weight 84.8 kg, last menstrual period 03/31/2018, SpO2 96 %.Body mass index is 34.2 kg/m.  See admit note MSE  COGNITIVE FEATURES THAT CONTRIBUTE TO RISK:  Closed-mindedness and Loss of executive function    SUICIDE RISK:   Moderate:  Frequent suicidal ideation with limited intensity, and  duration, some specificity in terms of plans, no associated intent, good self-control, limited dysphoria/symptomatology, some risk factors present, and identifiable protective factors, including available and accessible social support.  PLAN OF CARE: Patient will be admitted to inpatient psychiatric unit for stabilization and safety. Will provide and encourage milieu participation. Provide medication management and maked adjustments as needed.  Will follow daily.    I certify that inpatient services furnished can reasonably be expected to improve the patient's condition.   Jenne Campus, MD 04/01/2018, 6:14 PM

## 2018-04-02 ENCOUNTER — Inpatient Hospital Stay (HOSPITAL_COMMUNITY): Admission: AD | Admit: 2018-04-02 | Payer: BLUE CROSS/BLUE SHIELD | Admitting: Psychiatry

## 2018-04-02 DIAGNOSIS — F322 Major depressive disorder, single episode, severe without psychotic features: Secondary | ICD-10-CM

## 2018-04-02 DIAGNOSIS — G4700 Insomnia, unspecified: Secondary | ICD-10-CM

## 2018-04-02 LAB — TSH: TSH: 1.255 u[IU]/mL (ref 0.350–4.500)

## 2018-04-02 MED ORDER — NICOTINE POLACRILEX 2 MG MT GUM
2.0000 mg | CHEWING_GUM | OROMUCOSAL | Status: DC | PRN
  Administered 2018-04-02 – 2018-04-03 (×3): 2 mg via ORAL
  Filled 2018-04-02: qty 1

## 2018-04-02 MED ORDER — AMLODIPINE BESYLATE 5 MG PO TABS
5.0000 mg | ORAL_TABLET | Freq: Every day | ORAL | Status: DC
  Filled 2018-04-02 (×4): qty 1

## 2018-04-02 MED ORDER — AMLODIPINE BESYLATE 10 MG PO TABS
10.0000 mg | ORAL_TABLET | Freq: Every day | ORAL | Status: DC
  Filled 2018-04-02 (×2): qty 1

## 2018-04-02 NOTE — BHH Counselor (Signed)
CSW attempted to complete PSA two times with pt today. Both times, pt was sleeping and not waking up when called. CSW will continue to assess.   Katyana Trolinger S. Ouida Sills, MSW, LCSW Clinical Social Worker 04/02/2018 2:52 PM

## 2018-04-02 NOTE — Progress Notes (Signed)
Recreation Therapy Notes  Date: 10.21.19 Time: 0930 Location: [address] Dayroom  Group Topic: Stress Management  Goal Area(s) Addresses:  Patient will verbalize importance of using healthy stress management.  Patient will identify positive emotions associated with healthy stress management.   Behavioral Response: Engaged  Intervention: Stress Management  Activity : Meditation.  LRT introduced the stress management technique of meditation.  LRT played a meditation that dealt with impermanence.  Patients were to follow along as the meditation played to engage in the meditation.  Education:  Stress Management, Discharge Planning.   Education Outcome: Acknowledges edcuation/In group clarification offered/Needs additional education  Clinical Observations/Feedback: Pt attended and participated in group.    Victorino Sparrow, LRT/CTRS         Ria Comment, Nereida Schepp A 04/02/2018 11:06 AM

## 2018-04-02 NOTE — Plan of Care (Signed)
  Problem: Safety: Goal: Periods of time without injury will increase Outcome: Progressing Note:  Pt has not harmed self or others tonight.  She denies SI/HI and verbally contracts for safety.    

## 2018-04-02 NOTE — Progress Notes (Signed)
D.  Pt initially in bed on approach, had been sleeping.  Pt did not get up to attend evening AA group as she is a new admission to the unit.  Pt did get up later for night medication.  Pt appeared very tearful and requested to speak to Chaplain tomorrow.  Pt does endorse passive SI but agrees to come to staff rather than harm herself on the unit.  A.  Support and encouragement offered, order put in per protocol for spiritual consult.  Medication given as ordered  R.  Pt remains safe on the unit, will continue to monitor.

## 2018-04-02 NOTE — Progress Notes (Signed)
Patient did not attend the evening speaker [place] meeting. Pt was notified that group was beginning but declined reporting not needing to go to an [place] meeting. Pt was invited to join.

## 2018-04-02 NOTE — Progress Notes (Signed)
Patient was depressed and anxious upon approach this morning. Patient stated she slept okay and doesn't have any complaints. Denies SI HI AVH. Denies physical pain.  Patient is compliant with mediations prescribed per provider. No side effects noted. Safety is maintained with 15 minute checks. Will continue to monitor.

## 2018-04-02 NOTE — Progress Notes (Signed)
D: Pt was at nurse's station upon initial approach.  Pt presents with anxious, depressed affect and mood.  She reports her day has been "all right" and her goal is "focusing on myself and my health."  Pt reports she had a good visit with her daughters tonight.  Pt denies SI/HI, denies hallucinations, denies pain.  Pt has been visible in milieu interacting with peers and staff appropriately.  Pt did not attend evening group.  A: Introduced self to pt.  Met with pt 1:1.  Actively listened to pt and offered support and encouragement. Medications offered per order.  PRN medication administered for anxiety.  Q15 minute safety checks maintained.  R: Pt is safe on the unit.  Pt is compliant with medications except for Norvasc, which she refused.  Pt states "I'd rather talk to my regular doctor before I start a blood pressure med."  Pt verbally contracts for safety and reports she will inform staff of needs and concerns.  Will continue to monitor and assess.   

## 2018-04-02 NOTE — Progress Notes (Signed)
NUTRITION ASSESSMENT  Pt identified as at risk on the Malnutrition Screen Tool  INTERVENTION: 1. Supplements: Continue Ensure Enlive po BID, each supplement provides 350 kcal and 20 grams of protein  NUTRITION DIAGNOSIS: Unintentional weight loss related to sub-optimal intake as evidenced by pt report.   Goal: Pt to meet >/= 90% of their estimated nutrition needs.  Monitor:  PO intake  Assessment:  Pt admitted with depression, reports poor appetite. Per weight records, pt weighed 200 lb on 10/14, lost 13 lb since then (7% wt loss x 1 week). Ensure supplements have been ordered per [facility] protocol, will continue.  Height: Ht Readings from Last 1 Encounters:  04/01/18 5\' 2"  (1.575 m)    Weight: Wt Readings from Last 1 Encounters:  04/01/18 84.8 kg    Weight Hx: Wt Readings from Last 10 Encounters:  04/01/18 84.8 kg  03/26/18 90.7 kg  06/09/15 81.6 kg  08/06/13 73.5 kg    BMI:  Body mass index is 34.2 kg/m. Pt meets criteria for obesity based on current BMI.  Estimated Nutritional Needs: Kcal: 25-30 kcal/kg Protein: > 1 gram protein/kg Fluid: 1 ml/kcal  Diet Order:  Diet Order    None     Pt is also offered choice of unit snacks mid-morning and mid-afternoon.   Lab results and medications reviewed.   Clayton Bibles, MS, RD, [place] Dietitian Pager: [phone] After Hours Pager: [phone]

## 2018-04-02 NOTE — Plan of Care (Signed)
  Problem: Education: Goal: Mental status will improve Outcome: Progressing   Problem: Activity: Goal: Sleeping patterns will improve Outcome: Progressing   Problem: Education: Goal: Emotional status will improve Outcome: Not Progressing

## 2018-04-02 NOTE — [facility] Group Notes (Signed)
LCSW Group Therapy Note   04/02/2018 1:15pm   Type of Therapy and Topic:  Group Therapy:  Overcoming Obstacles   Participation Level:  Did Not Attend-pt sleeping in room. Invited.   Description of Group:    In this group patients will be encouraged to explore what they see as obstacles to their own wellness and recovery. They will be guided to discuss their thoughts, feelings, and behaviors related to these obstacles. The group will process together ways to cope with barriers, with attention given to specific choices patients can make. Each patient will be challenged to identify changes they are motivated to make in order to overcome their obstacles. This group will be process-oriented, with patients participating in exploration of their own experiences as well as giving and receiving support and challenge from other group members.   Therapeutic Goals: 1. Patient will identify personal and current obstacles as they relate to admission. 2. Patient will identify barriers that currently interfere with their wellness or overcoming obstacles.  3. Patient will identify feelings, thought process and behaviors related to these barriers. 4. Patient will identify two changes they are willing to make to overcome these obstacles:      Summary of Patient Progress   x   Therapeutic Modalities:   Cognitive Behavioral Therapy Solution Focused Therapy Motivational Interviewing Relapse Prevention Therapy  Avelina Laine, LCSW 04/02/2018 2:51 PM

## 2018-04-02 NOTE — Tx Team (Signed)
Interdisciplinary Treatment and Diagnostic Plan Update  04/02/2018 Time of Session: [place] MRN: [id number]  Principal Diagnosis: MDD, recurrent, severe  Secondary Diagnoses: Active Problems:   * No active hospital problems. *   Current Medications:  Current Facility-Administered Medications  Medication Dose Route Frequency Provider Last Rate Last Dose  . feeding supplement (ENSURE ENLIVE) (ENSURE ENLIVE) liquid 237 mL  237 mL Oral BID BM Cobos, Myer Peer, MD   237 mL at 04/02/18 1113  . FLUoxetine (PROZAC) capsule 10 mg  10 mg Oral Daily Cobos, Myer Peer, MD   10 mg at 04/02/18 0750  . hydrOXYzine (ATARAX/VISTARIL) tablet 25 mg  25 mg Oral Q6H PRN Cobos, Myer Peer, MD   25 mg at 04/02/18 1114  . mirtazapine (REMERON) tablet 7.5 mg  7.5 mg Oral QHS Cobos, Myer Peer, MD   7.5 mg at 04/01/18 2307   PTA Medications: Medications Prior to Admission  Medication Sig Dispense Refill Last Dose  . fluvoxaMINE (LUVOX) 100 MG tablet Take 100 mg by mouth at bedtime.   Past Week at Unknown time  . lithium carbonate 150 MG capsule Take 150 mg by mouth at bedtime.   Past Week at Unknown time  . zaleplon (SONATA) 10 MG capsule Take 10 mg by mouth at bedtime as needed for sleep.   Past Week at Unknown time    Treatment Modalities: Medication Management, Group therapy, Case management,  1 to 1 session with clinician, Psychoeducation, Recreational therapy.   Physician Treatment Plan for Primary Diagnosis: MDD, recurrent, severe Long Term Goal(s): Improvement in symptoms so as ready for discharge Improvement in symptoms so as ready for discharge   Short Term Goals: Ability to identify changes in lifestyle to reduce recurrence of condition will improve Ability to maintain clinical measurements within normal limits will improve Ability to identify changes in lifestyle to reduce recurrence of condition will improve Ability to verbalize feelings will improve Ability to disclose  and discuss suicidal ideas Ability to demonstrate self-control will improve Ability to identify and develop effective coping behaviors will improve Ability to maintain clinical measurements within normal limits will improve  Medication Management: Evaluate patient's response, side effects, and tolerance of medication regimen.  Therapeutic Interventions: 1 to 1 sessions, Unit Group sessions and Medication administration.  Evaluation of Outcomes: Not Met  Physician Treatment Plan for Secondary Diagnosis: Active Problems:   * No active hospital problems. *  Long Term Goal(s): Improvement in symptoms so as ready for discharge Improvement in symptoms so as ready for discharge   Short Term Goals: Ability to identify changes in lifestyle to reduce recurrence of condition will improve Ability to maintain clinical measurements within normal limits will improve Ability to identify changes in lifestyle to reduce recurrence of condition will improve Ability to verbalize feelings will improve Ability to disclose and discuss suicidal ideas Ability to demonstrate self-control will improve Ability to identify and develop effective coping behaviors will improve Ability to maintain clinical measurements within normal limits will improve     Medication Management: Evaluate patient's response, side effects, and tolerance of medication regimen.  Therapeutic Interventions: 1 to 1 sessions, Unit Group sessions and Medication administration.  Evaluation of Outcomes: Not Met   RN Treatment Plan for Primary Diagnosis: MDD, recurrent, severe Long Term Goal(s): Knowledge of disease and therapeutic regimen to maintain health will improve  Short Term Goals: Ability to remain free from injury will improve, Ability to verbalize frustration and anger appropriately will improve, Ability to demonstrate self-control and  Ability to disclose and discuss suicidal ideas  Medication Management: RN will administer  medications as ordered by provider, will assess and evaluate patient's response and provide education to patient for prescribed medication. RN will report any adverse and/or side effects to prescribing provider.  Therapeutic Interventions: 1 on 1 counseling sessions, Psychoeducation, Medication administration, Evaluate responses to treatment, Monitor vital signs and CBGs as ordered, Perform/monitor CIWA, COWS, AIMS and Fall Risk screenings as ordered, Perform wound care treatments as ordered.  Evaluation of Outcomes: Not Met   LCSW Treatment Plan for Primary Diagnosis: MDD, recurrent, severe Long Term Goal(s): Safe transition to appropriate next level of care at discharge, Engage patient in therapeutic group addressing interpersonal concerns.  Short Term Goals: Engage patient in aftercare planning with referrals and resources, Increase ability to appropriately verbalize feelings, Increase emotional regulation and Facilitate patient progression through stages of change regarding substance use diagnoses and concerns  Therapeutic Interventions: Assess for all discharge needs, 1 to 1 time with Social worker, Explore available resources and support systems, Assess for adequacy in community support network, Educate family and significant other(s) on suicide prevention, Complete Psychosocial Assessment, Interpersonal group therapy.  Evaluation of Outcomes: Progressing  Progress in Treatment: Attending groups: Yes Participating in groups: Minimally, when she attends.  Taking medication as prescribed: Yes. Toleration medication: Yes. Family/Significant other contact made: No, will contact:  family member if pt consents to collateral contact Patient understands diagnosis: Yes. Discussing patient identified problems/goals with staff: Yes. Medical problems stabilized or resolved: Yes. Denies suicidal/homicidal ideation: Yes. Issues/concerns per patient self-inventory: No. Other: n/a   New problem(s)  identified: No, Describe:  n/a  New Short Term/Long Term Goal(s):  medication management for mood stabilization; elimination of SI thoughts; development of comprehensive mental wellness/sobriety plan.   Patient Goals:  "to learn how to cope with depression and issues with my husband."   Discharge Plan or Barriers: CSW assessing--pt has follow-up in place with her provider at [place]. [place] pamphlet, Mobile Crisis information, and AA/NA information provided to patient for additional community support and resources.   Reason for Continuation of Hospitalization: Anxiety Depression Medication stabilization Suicidal ideation  Estimated Length of Stay: Thursday, 04/05/18  Attendees: Patient: 04/02/2018 11:19 AM  Physician: Dr. Parke Poisson MD; Dr. Nancy Fetter MD 04/02/2018 11:19 AM  Nursing: Yetta Flock RN; Roderic Palau RN 04/02/2018 11:19 AM  RN Care Manager:x 04/02/2018 11:19 AM  Social Worker: Janice Norrie LCSW 04/02/2018 11:19 AM  Recreational Therapist: x 04/02/2018 11:19 AM  Other: Lindell Spar NP; Ricky Ala NP 04/02/2018 11:19 AM  Other:  04/02/2018 11:19 AM  Other: 04/02/2018 11:19 AM    Scribe for Treatment Team: Avelina Laine, LCSW 04/02/2018 11:19 AM

## 2018-04-02 NOTE — Progress Notes (Addendum)
[facility] MD Progress Note  04/02/2018 1:12 PM Amanda Marks  MRN:  [id number]  Subjective:  Amanda Marks reports, "I went to the ED because I was stressed, having bad anxiety & chest pain. While at the ED, I was asked if I was suicidal, I said yes. But, it is not like I'm just gonna kill myself. I have a daughter with special needs that needs me. My husband recently told me that he is leaving me. That through me off because I thought my marriage is still good. What am I suppose to do with such news. 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 I'm not suppose to be here. I need to be discharged to go home to my daughter. I feel locked up in here".  42 year old female, reports she went to [facility] ED voluntarily  due to "being unable to sleep, not wanting to eat, feeling anxious, chest tightness" . Was medically cleared in ED.  States above symptoms started after her husband recently told her he is leaving her. States " it was a huge surprise, things were going OK". Reports that before this stressor  she was not feeling depressed and doing " all right", but does endorse history of chronic/recurrent depression in the past  Endorses recent suicidal ideations, with thoughts of overdosing or shooting herself, although states  " I am not going to do it because my daughter needs me". As above, she also reports increased anxiety,panic symptoms recently, such as palpitations, " feeling shaky, tremulous", and subjective sense of chest tightness.   [name] is seen, chart reviewed. The chart findings discussed with the treatment. She is alert, oriented & aware of situation. She is visible on the unit attending group sessions. She says she is upset for being locked up in here. She is asking to be discharged to her home today to go care for her daughter who is scheduled to have surgery tomorrow. She says she has an outpatient psychiatrist who is managing her mental health issues. She reports that she was asked a question  at the ED about having thoughts of suicide at times, and she replied yes. She says that does not mean that she is going to hurt herself or anyone else. She denies any SIHI, AVH, delusional thoughts or paranoia. She does not appear to be responding to any internal stimuli. [name] is taking & tolerating her treatment regimen. Denies any adverse effects. She has agreed to continue current plan of care as already in progress.  Principal Problem: Major depressive disorder, single episode, severe ([place])  Diagnosis:   Patient Active Problem List   Diagnosis Date Noted  . Major depressive disorder, single episode, severe ([place]) [F32.2] 04/02/2018    Priority: High   Total Time spent with patient: Greater than 30 minutes  Past Psychiatric History: See H&P.  Past Medical History:  Past Medical History:  Diagnosis Date  . Depression   . Kidney stones   . Migraines     Past Surgical History:  Procedure Laterality Date  . CHOLECYSTECTOMY    . STENT PLACE LEFT URETER ([place] HX)    . TUBAL LIGATION     Family History: History reviewed. No pertinent family history.  Family Psychiatric  History: See H&P  Social History:  Social History   Substance and Sexual Activity  Alcohol Use No  . Frequency: Never     Social History   Substance and Sexual Activity  Drug Use Never    Social History  Socioeconomic History  . Marital status: Married    Spouse name: Not on file  . Number of children: Not on file  . Years of education: Not on file  . Highest education level: Not on file  Occupational History  . Not on file  Social Needs  . Financial resource strain: Somewhat hard  . Food insecurity:    Worry: Sometimes true    Inability: Sometimes true  . Transportation needs:    Medical: No    Non-medical: No  Tobacco Use  . Smoking status: Current Every Day Smoker    Types: E-cigarettes  . Smokeless tobacco: Never Used  Substance and Sexual Activity  . Alcohol use: No    Frequency:  Never  . Drug use: Never  . Sexual activity: Yes  Lifestyle  . Physical activity:    Days per week: Not on file    Minutes per session: Not on file  . Stress: Very much  Relationships  . Social connections:    Talks on phone: Not on file    Gets together: Not on file    Attends religious service: Not on file    Active member of club or organization: Not on file    Attends meetings of clubs or organizations: Not on file    Relationship status: Married  Other Topics Concern  . Not on file  Social History Narrative  . Not on file   Additional Social History:  Pain Medications: None Prescriptions: Fluvoxamine Maleate 100mg  daily, Sonata 5mg ; Lithium Carbonate 150mg  daily Over the Counter: None History of alcohol / drug use?: No history of alcohol / drug abuse(Pt denies use.)  Sleep: Poor  Appetite:  Good  Current Medications: Current Facility-Administered Medications  Medication Dose Route Frequency Provider Last Rate Last Dose  . feeding supplement (ENSURE ENLIVE) (ENSURE ENLIVE) liquid 237 mL  237 mL Oral BID BM Bertina Guthridge, Myer Peer, MD   237 mL at 04/02/18 1113  . FLUoxetine (PROZAC) capsule 10 mg  10 mg Oral Daily Dacy Enrico, Myer Peer, MD   10 mg at 04/02/18 0750  . hydrOXYzine (ATARAX/VISTARIL) tablet 25 mg  25 mg Oral Q6H PRN Imani Fiebelkorn, Myer Peer, MD   25 mg at 04/02/18 1114  . mirtazapine (REMERON) tablet 7.5 mg  7.5 mg Oral QHS Taneasha Fuqua, Myer Peer, MD   7.5 mg at 04/01/18 2307   Lab Results:  Results for orders placed or performed during the hospital encounter of 04/01/18 (from the past 48 hour(s))  TSH     Status: None   Collection Time: 04/02/18  6:26 AM  Result Value Ref Range   TSH 1.255 0.350 - 4.500 uIU/mL    Comment: Performed by a 3rd Generation assay with a functional sensitivity of <=0.01 uIU/mL. Performed at [facility], [place] [address] [place], [place] [address]    Blood Alcohol level:  No results found for: [facility]  Metabolic Disorder  Labs: No results found for: HGBA1C, MPG No results found for: PROLACTIN No results found for: CHOL, TRIG, HDL, CHOLHDL, VLDL, LDLCALC  Physical Findings: AIMS: Facial and Oral Movements Muscles of Facial Expression: None, normal Lips and Perioral Area: None, normal Jaw: None, normal Tongue: None, normal,Extremity Movements Upper (arms, wrists, hands, fingers): None, normal Lower (legs, knees, ankles, toes): None, normal, Trunk Movements Neck, shoulders, hips: None, normal, Overall Severity Severity of abnormal movements (highest score from questions above): None, normal Incapacitation due to abnormal movements: None, normal Patient's awareness of abnormal movements (rate only patient's report):  No Awareness, Dental Status Current problems with teeth and/or dentures?: No Does patient usually wear dentures?: No  CIWA:    COWS:     Musculoskeletal: Strength & Muscle Tone: within normal limits Gait & Station: normal Patient leans: N/A  Psychiatric Specialty Exam: Physical Exam  Nursing note and vitals reviewed.   Review of Systems  Psychiatric/Behavioral: Positive for depression.    Blood pressure (!) 132/104, pulse (!) 120, temperature 98.5 F (36.9 C), resp. rate 18, height 5\' 2"  (1.575 m), weight 84.8 kg, last menstrual period 03/31/2018, SpO2 96 %.Body mass index is 34.2 kg/m.  General Appearance: Fairly Groomed  Eye Contact:  Fair  Speech:  Normal Rate  Volume:  Decreased  Mood:  Depressed  Affect:  Constricted and Depressed  Thought Process:  Linear and Descriptions of Associations: Intact  Orientation:  Full (Time, Place, and Person)  Thought Content:  no hallucinations, no delusions, not internally preoccupied   Suicidal Thoughts:  No  Homicidal Thoughts:  No  Memory:  recent and remote grossly intact   Judgement:  Fair  Insight:  Fair  Psychomotor Activity:  Normal  Concentration:  Concentration: Fair and Attention Span: Fair  Recall:  Good  Fund of  Knowledge:  Good  Language:  Good  Akathisia:  Negative  Handed:  Right  AIMS (if indicated):     Assets:  Communication Skills Resilience  ADL's:  Intact  Cognition:  WNL  Sleep: 3.25        Treatment Plan Summary: Daily contact with patient to assess and evaluate symptoms and progress in treatment.  - Continue inpatient hospitalization.  - Will continue today 04/02/2018 plan as below except where it is noted.  Depression.     - Continue Prozac 20 mg po daily.  Anxiety.     - Continue Vistaril 25 mg po Q 6 hours prn.  Insomnia.      - Continue Mirtazapine 7.5 mg po Q hs.  - Patient is encouraged to continue to attend & participate in the group sessions.  - Discharge disposition plan is ongoing.  Lindell Spar, NP, PMHNP, FNP-BC. 04/02/2018, 1:12 PM   Agree with NP Progress Note. * 18:00 BP 130/95, pulse 120.  Patient's BPs have tended to be elevated . She denies ETOH or BZD abuse , and is not presenting with symptoms of WDL. She reports she has been told her diastolic BP was elevated in the past, but has not been diagnosed with HTN. Labs- from [place] ED- BUN 9, Creatinine 0.9 . Have reviewed with patient and with hospitalist consultant- start Amlodipine 5mg rs QDAY  Initially. Gabriel Earing MD

## 2018-04-03 DIAGNOSIS — F339 Major depressive disorder, recurrent, unspecified: Secondary | ICD-10-CM

## 2018-04-03 MED ORDER — AMLODIPINE BESYLATE 5 MG PO TABS: 5.0000 mg | ORAL_TABLET | Freq: Every day | ORAL | 0 refills | Status: DC

## 2018-04-03 MED ORDER — MIRTAZAPINE 7.5 MG PO TABS: 7.5000 mg | ORAL_TABLET | Freq: Every day | ORAL | 0 refills | Status: DC

## 2018-04-03 MED ORDER — TRAZODONE HCL 50 MG PO TABS
50.0000 mg | ORAL_TABLET | Freq: Every day | ORAL | Status: DC
  Filled 2018-04-03 (×2): qty 1

## 2018-04-03 MED ORDER — HYDROXYZINE HCL 25 MG PO TABS
25.0000 mg | ORAL_TABLET | Freq: Four times a day (QID) | ORAL | Status: DC | PRN
  Filled 2018-04-03: qty 10

## 2018-04-03 MED ORDER — ALUM & MAG HYDROXIDE-SIMETH 200-200-20 MG/5ML PO SUSP: 30.0000 mL | ORAL | Status: DC | PRN

## 2018-04-03 MED ORDER — MAGNESIUM HYDROXIDE 400 MG/5ML PO SUSP
30.0000 mL | Freq: Every day | ORAL | Status: DC | PRN
Start: 2018-04-03 — End: 2018-04-03

## 2018-04-03 MED ORDER — HYDROXYZINE HCL 25 MG PO TABS: 25.0000 mg | ORAL_TABLET | Freq: Four times a day (QID) | ORAL | 0 refills | Status: DC | PRN

## 2018-04-03 MED ORDER — FLUOXETINE HCL 10 MG PO CAPS: 10.0000 mg | ORAL_CAPSULE | Freq: Every day | ORAL | 0 refills | Status: DC

## 2018-04-03 NOTE — Discharge Summary (Signed)
Physician Discharge Summary Note  Patient:  Amanda Marks is an 42 y.o., female MRN:  [id number] DOB:  Mar 19, 1976 Patient phone:  [phone] (home)  Patient address:   [place] [address],  Total Time spent with patient: 20 minutes  Date of Admission:  04/01/2018 Date of Discharge: 04/03/18  Reason for Admission:  Worsening depression with SI  Principal Problem: Major depressive disorder, single episode, severe [facility]) Discharge Diagnoses: Patient Active Problem List   Diagnosis Date Noted  . Major depressive disorder, single episode, severe ([place]) [F32.2] 04/02/2018    Past Psychiatric History: reports history of one prior psychiatric admission in her early 12s for depression and suicidal ideations. Reports history of suicide attempt by overdosing as a teenager .  Denies history of psychosis. Reports history of chronic depression, which waxes and wanes . States " I feel I have been depressed for as long as I can remember ".  Reports history of excessive worrying and describes occasional panic attacks. Denies agoraphobia. Denies history of violence .  Past Medical History:  Past Medical History:  Diagnosis Date  . Depression   . Kidney stones   . Migraines     Past Surgical History:  Procedure Laterality Date  . CHOLECYSTECTOMY    . STENT PLACE LEFT URETER ([facility])    . TUBAL LIGATION     Family History: History reviewed. No pertinent family history. Family Psychiatric  History:  reports paternal grandmother has depression, anxiety. Two aunts ( one maternal and one paternal) committed suicide , father has remote history of alcohol abuse  Social History:  Social History   Substance and Sexual Activity  Alcohol Use No  . Frequency: Never     Social History   Substance and Sexual Activity  Drug Use Never    Social History   Socioeconomic History  . Marital status: Married    Spouse name: Not on file  . Number of children: Not on file  .  Years of education: Not on file  . Highest education level: Not on file  Occupational History  . Not on file  Social Needs  . Financial resource strain: Somewhat hard  . Food insecurity:    Worry: Sometimes true    Inability: Sometimes true  . Transportation needs:    Medical: No    Non-medical: No  Tobacco Use  . Smoking status: Current Every Day Smoker    Types: E-cigarettes  . Smokeless tobacco: Never Used  Substance and Sexual Activity  . Alcohol use: No    Frequency: Never  . Drug use: Never  . Sexual activity: Yes  Lifestyle  . Physical activity:    Days per week: Not on file    Minutes per session: Not on file  . Stress: Very much  Relationships  . Social connections:    Talks on phone: Not on file    Gets together: Not on file    Attends religious service: Not on file    Active member of club or organization: Not on file    Attends meetings of clubs or organizations: Not on file    Relationship status: Married  Other Topics Concern  . Not on file  Social History Narrative  . Not on file    Hospital Course:   04/01/18 [facility] Counselor Assessment: 42 y.o. female.  Patient says her husband told her this past Monday (10/14) that he was thinking about leaving her.  Today she found out that he definitely plans  on leaving her on Tuesday (10/22).  She has an 76 year old daughter who has autism.  Patient says she has been having anxiety attacks which cause cp.  Patient has been having SI w/ plan to overdose.  She had also told Amber Nodal PA that she had a plan to shoot herself with a gun.  She told this clinician she has access to a gun.  Pt had one previous attempt to kill herself (overdose) 12 years ago.  Pt says however "I'm not going to do it because I keep thinking about my daughter." Pt denies any HI or A/V hallucinations.  Patient denies any ETOH or other drug use. Patient says that she sees a Teacher, music at [employer] in [place].  Patient's next appt is  October 25.  Pt was at [facility] 12 years ago after previous attempt. Clinician discussed patient care with Patriciaann Clan, PA.  He recommended inpatient psychiatric care.  Patient has been accepted to [facility] 304-2 to Dr. Parke Poisson.  Patient did not wish to come so Dr. Maxwell Caul at [place] placed patient under IVC.  Pt to come after 08:00.  04/01/18 [facility] MD Assessment: 42 year old female, reports she went to [facility] ED voluntarily  due to " being unable to sleep, not wanting to eat, feeling anxious, chest tightness" . Was medically cleared in ED.  States above symptoms started after her husband recently told her he is leaving her. States " it was a huge surprise, things were going OK". Reports that before this stressor  she was not feeling depressed and doing " all right", but does endorse history of chronic/recurrent depression in the past . Endorses neuro-vegetative symptoms of depression as below, and endorses recent suicidal ideations, with thoughts of overdosing or of shooting self , although states  " I am not going to do it because my daughter needs me". As above, she also reports increased anxiety,panic symptoms recently, such as palpitations, " feeling shaky, tremulous", and subjective sense of chest tightness .   Patient remained on the [facility] unit for 2 days. The patient stabilized on medication and therapy. Patient was discharged on Prozac 10 mg Daily, Remeron 7.5 mg QHS, Vistaril 25 mg Q6H PRN, and Norvasc 5 mg Daily. Patient has shown improvement with improved mood, affect, sleep, appetite, and interaction. Patient has attended group and participated. Patient has been seen in the day room interacting with peers and staff appropriately. Patient denies any SI/HI/AVH and contracts for safety. Patient agrees to follow up at [place]. Patient is provided with prescriptions for their medications upon discharge.   Physical Findings: AIMS: Facial and Oral Movements Muscles of Facial  Expression: None, normal Lips and Perioral Area: None, normal Jaw: None, normal Tongue: None, normal,Extremity Movements Upper (arms, wrists, hands, fingers): None, normal Lower (legs, knees, ankles, toes): None, normal, Trunk Movements Neck, shoulders, hips: None, normal, Overall Severity Severity of abnormal movements (highest score from questions above): None, normal Incapacitation due to abnormal movements: None, normal Patient's awareness of abnormal movements (rate only patient's report): No Awareness, Dental Status Current problems with teeth and/or dentures?: No Does patient usually wear dentures?: No  CIWA:    COWS:     Musculoskeletal: Strength & Muscle Tone: within normal limits Gait & Station: normal Patient leans: N/A  Psychiatric Specialty Exam: Physical Exam  Nursing note and vitals reviewed. Constitutional: She is oriented to person, place, and time. She appears well-developed and well-nourished.  Respiratory: Effort normal.  Musculoskeletal: Normal range of  motion.  Neurological: She is alert and oriented to person, place, and time.  Skin: Skin is warm.    Review of Systems  Constitutional: Negative.   HENT: Negative.   Eyes: Negative.   Respiratory: Negative.   Cardiovascular: Negative.   Gastrointestinal: Negative.   Genitourinary: Negative.   Musculoskeletal: Negative.   Skin: Negative.   Neurological: Negative.   Endo/Heme/Allergies: Negative.   Psychiatric/Behavioral: Negative.     Blood pressure (!) 133/98, pulse (!) 118, temperature 99 F (37.2 C), temperature source Oral, resp. rate 18, height 5\' 2"  (1.575 m), weight 84.8 kg, last menstrual period 03/31/2018, SpO2 96 %.Body mass index is 34.2 kg/m.  General Appearance: Casual  Eye Contact:  Good  Speech:  Clear and Coherent and Normal Rate  Volume:  Normal  Mood:  Euthymic  Affect:  Congruent  Thought Process:  Goal Directed and Descriptions of Associations: Intact  Orientation:  Full  (Time, Place, and Person)  Thought Content:  WDL  Suicidal Thoughts:  No  Homicidal Thoughts:  No  Memory:  Immediate;   Good Recent;   Good Remote;   Good  Judgement:  Fair  Insight:  Fair  Psychomotor Activity:  Normal  Concentration:  Concentration: Good and Attention Span: Good  Recall:  Good  Fund of Knowledge:  Good  Language:  Good  Akathisia:  No  Handed:  Right  AIMS (if indicated):     Assets:  Communication Skills Desire for Improvement Financial Resources/Insurance Housing Physical Health Social Support Transportation  ADL's:  Intact  Cognition:  WNL  Sleep:  Number of Hours: 5     Have you used any form of tobacco in the last 30 days? (Cigarettes, Smokeless Tobacco, Cigars, and/or Pipes): No  Has this patient used any form of tobacco in the last 30 days? (Cigarettes, Smokeless Tobacco, Cigars, and/or Pipes) Yes, No  Blood Alcohol level:  No results found for: [facility]  Metabolic Disorder Labs:  No results found for: HGBA1C, MPG No results found for: PROLACTIN No results found for: CHOL, TRIG, HDL, CHOLHDL, VLDL, LDLCALC  See Psychiatric Specialty Exam and Suicide Risk Assessment completed by Attending Physician prior to discharge.  Discharge destination:  Home  Is patient on multiple antipsychotic therapies at discharge:  No   Has Patient had three or more failed trials of antipsychotic monotherapy by history:  No  Recommended Plan for Multiple Antipsychotic Therapies: NA   Allergies as of 04/03/2018      Reactions   Oxycodone-acetaminophen Nausea And Vomiting      Medication List    STOP taking these medications   fluvoxaMINE 100 MG tablet Commonly known as:  LUVOX   lithium carbonate 150 MG capsule   zaleplon 10 MG capsule Commonly known as:  SONATA     TAKE these medications     Indication  amLODipine 5 MG tablet Commonly known as:  NORVASC Take 1 tablet (5 mg total) by mouth daily. For high blood pressure Start taking on:   04/04/2018  Indication:  High Blood Pressure Disorder   FLUoxetine 10 MG capsule Commonly known as:  PROZAC Take 1 capsule (10 mg total) by mouth daily. For mood control Start taking on:  04/04/2018  Indication:  mood stability   hydrOXYzine 25 MG tablet Commonly known as:  ATARAX/VISTARIL Take 1 tablet (25 mg total) by mouth every 6 (six) hours as needed for anxiety.  Indication:  Feeling Anxious   mirtazapine 7.5 MG tablet Commonly known as:  REMERON Take 1  tablet (7.5 mg total) by mouth at bedtime. For mood control and sleep  Indication:  sleep and mood stability      Follow-up Information    [facility] up on 04/06/2018.   Specialty:  Behavioral Health Why:  Hospital follow-up/medication management on Friday, 10/25 at 2:00PM with Nada Libman. If you are interested in pursuing therapy, please notify the office at this visit. Thank you.  Contact information: [address] [place] [address] [phone]           Follow-up recommendations:  Continue activity as tolerated. Continue diet as recommended by your PCP. Ensure to keep all appointments with outpatient providers.  Comments:  Patient is instructed prior to discharge to: Take all medications as prescribed by his/her mental healthcare provider. Report any adverse effects and or reactions from the medicines to his/her outpatient provider promptly. Patient has been instructed & cautioned: To not engage in alcohol and or illegal drug use while on prescription medicines. In the event of worsening symptoms, patient is instructed to call the crisis hotline, 911 and or go to the nearest ED for appropriate evaluation and treatment of symptoms. To follow-up with his/her primary care provider for your other medical issues, concerns and or health care needs.    Signed: [place], FNP 04/03/2018, 10:53 AM

## 2018-04-03 NOTE — BHH Suicide Risk Assessment (Signed)
[facility] Discharge Suicide Risk Assessment   Principal Problem: Major depressive disorder, single episode, severe [facility]) Discharge Diagnoses:  Patient Active Problem List   Diagnosis Date Noted  . Major depressive disorder, single episode, severe ([place]) [F32.2] 04/02/2018    Total Time spent with patient: 15 minutes  Musculoskeletal: Strength & Muscle Tone: within normal limits Gait & Station: normal Patient leans: N/A  Psychiatric Specialty Exam: Review of Systems  All other systems reviewed and are negative.   Blood pressure (!) 133/98, pulse (!) 118, temperature 99 F (37.2 C), temperature source Oral, resp. rate 18, height 5\' 2"  (1.575 m), weight 84.8 kg, last menstrual period 03/31/2018, SpO2 96 %.Body mass index is 34.2 kg/m.  General Appearance: Casual  Eye Contact::  Fair  Speech:  Normal Rate409  Volume:  Normal  Mood:  Anxious  Affect:  Congruent  Thought Process:  Coherent and Descriptions of Associations: Intact  Orientation:  Full (Time, Place, and Person)  Thought Content:  Logical  Suicidal Thoughts:  No  Homicidal Thoughts:  No  Memory:  Immediate;   Fair Recent;   Fair Remote;   Fair  Judgement:  Intact  Insight:  Fair  Psychomotor Activity:  Increased  Concentration:  Good  Recall:  Good  Fund of Knowledge:Good  Language: Good  Akathisia:  Negative  Handed:  Right  AIMS (if indicated):     Assets:  Communication Skills Desire for Improvement Financial Resources/Insurance Housing Physical Health Resilience Social Support  Sleep:  Number of Hours: 5  Cognition: WNL  ADL's:  Intact   Mental Status Per Nursing Assessment::   On Admission:  Self-harm thoughts  Demographic Factors:  Divorced or widowed, Caucasian and Living alone  Loss Factors: Loss of significant relationship  Historical Factors: Impulsivity  Risk Reduction Factors:   Sense of responsibility to family and Living with another person, especially a relative  Continued  Clinical Symptoms:  Depression:   Impulsivity Alcohol/Substance Abuse/Dependencies  Cognitive Features That Contribute To Risk:  None    Suicide Risk:  Minimal: No identifiable suicidal ideation.  Patients presenting with no risk factors but with morbid ruminations; may be classified as minimal risk based on the severity of the depressive symptoms  Follow-up Information    [facility] Follow up on 04/06/2018.   Specialty:  Behavioral Health Why:  Hospital follow-up/medication management on Friday, 10/25 at 2:00PM with Nada Libman. If you are interested in pursuing therapy, please notify the office at this visit. Thank you.  Contact information: [place] [address] [phone]           Plan Of Care/Follow-up recommendations:  Activity:  ad lib  Sharma Covert, MD 04/03/2018, 9:50 AM

## 2018-04-03 NOTE — Progress Notes (Signed)
Nursing Progress Note [phone]  Data: Patient presents anxious. Patient complaint with scheduled medications except Norvasc. Wants to speak with PCP before beginning this med. Patient denies pain/physical complaints. Patient completed self-inventory sheet and rates depression, hopelessness, and anxiety 5, 4 and 8 respectively. Patient rates their sleep and appetite as fair and good respectively. Patient states goal for today is to "focus on her relationship with God". Patient is seen attending groups and visible in the milieu. Patient currently denies SI/HI/AVH.   Action: Patient is educated about and provided medication per provider's orders. Patient safety maintained with q15 min safety checks and frequent rounding. Low fall risk precautions in place. Emotional support given. 1:1 interaction and active listening provided. Patient encouraged to attend meals, groups, and work on treatment plan and goals. Labs, vital signs and patient behavior monitored throughout shift.   Response: Patient remains safe on the unit at this time and agrees to come to staff with any issues/concerns. Patient is interacting with peers appropriately on the unit. Will continue to support and monitor.

## 2018-04-03 NOTE — Progress Notes (Signed)
  [facility] Adult Case Management Discharge Plan :  Will you be returning to the same living situation after discharge:  Yes,  home At discharge, do you have transportation home?: Yes,  mother Do you have the ability to pay for your medications: Yes,  BCBS insurance  Release of information consent forms completed and submitted to medical records by CSW.  Patient to Follow up at: Follow-up Information    [facility] Follow up on 04/06/2018.   Specialty:  Behavioral Health Why:  Hospital follow-up/medication management on Friday, 10/25 at 2:00PM with Nada Libman. If you are interested in pursuing therapy, please notify the office at this visit. Thank you.  Contact information: [place] [address] [phone]           Next level of care provider has access to [place] and Suicide Prevention discussed: Yes,  SPE Completed with pt; pt declined to consent to collateral contact. SPI pamphlet and mobile crisis information provided.  Have you used any form of tobacco in the last 30 days? (Cigarettes, Smokeless Tobacco, Cigars, and/or Pipes): No  Has patient been referred to the Quitline?: N/A patient is not a smoker  Patient has been referred for addiction treatment: Yes  Avelina Laine, LCSW 04/03/2018, 9:41 AM

## 2018-04-03 NOTE — Progress Notes (Signed)
[name], female   DOB: 27-Jul-1975, 42 y.o.   MRN: [id number]  Discharge Note  D) Patient discharged to lobby. Patient is. Patient denies SI/HI, AVH and is not delusional or psychotic. Patient in no acute distress. Patient has completed their Suicide Safety Plan. Patient provided an opportunity to complete and return Patient Satisfaction Survey.   A) Written and verbal discharge instructions given to the patient. Patient accepting to information and verbalized understanding. Patient agrees to the discharge plan. Opportunity for questions and concerns presented to patient. Patient denied any further questions or concerns. All belongings returned to patient. Patient signed for return of belongings and discharge paperwork. Patient provided a copy of their Suicide Safety Plan and has been provided a copy of the Patient Satisfaction Survey with return instructions.  R) Patient safely escorted to the lobby. Patient discharged from [facility] with prescriptions, personal belongings, follow-up appointment in place and discharge paperwork.

## 2018-04-03 NOTE — Progress Notes (Signed)
Chaplain providing support with Jannis around discharge plans, changes in relationship, grief and loss, faith.    [name] is preparing to discharge home.  Her spouse told her he is leaving today.  They have been together seven years, married three years.  This is a shock to [employer].  She describes doing everything she knew to be "a good christian wife" - committing all of her time to relationship and home.  She does not have many connections outside of relationship and is feeling confused and fearful about what happens next.  She is also caring for 55 year old daughter, who is autistic.  Daughter does not yet know about change in marital relationship.    [name] speaks with chaplain about her faith tradition.  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  She believes God can heal their marriage.  Conversation shifts toward finding care for herself and her daughter in the midst of unknown and where she is feeling God's presence in this journey.  [name] voice uncertainty around "how I am going to make it through."  Reflected on taking one step at a time and focusing on God's presence in this step.  As way of focusing on building support system, conversation focused on God's care showing up in the people who hold [place] up.  [name] is open to reaching out to an aunt, who has been a support for her in the past.  She will call this aunt this evening.  Chaplain prayed with [name] at her request.

## 2018-04-03 NOTE — BHH Suicide Risk Assessment (Addendum)
[facility] INPATIENT:  Family/Significant Other Suicide Prevention Education  Suicide Prevention Education:  Patient Refusal for Family/Significant Other Suicide Prevention Education: The patient Amanda Marks has refused to provide written consent for family/significant other to be provided Family/Significant Other Suicide Prevention Education during admission and/or prior to discharge.  Physician notified.  SPE completed with pt, as pt refused to consent to family contact. SPI pamphlet provided to pt and pt was encouraged to share information with support network, ask questions, and talk about any concerns relating to SPE. Pt denies access to guns/firearms and verbalized understanding of information provided. Mobile Crisis information also provided to pt.   Pt asked about gun in home. Pt states that her husband bought her a gun years ago, but she has never used it and states that he took it. "I have no idea where it even is." Pt continues to deny SI/HI/AVH and states, "I would never actually hurt myself or want to die. I have my daughters." Pt declined to allow collateral contact. "My husband is moving out of our home today. He's leaving me. He is not a good support."   Avelina Laine LCSW 04/03/2018, 9:41 AM
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
## 2018-04-06 ENCOUNTER — Ambulatory Visit: Payer: BLUE CROSS/BLUE SHIELD | Admitting: Physician Assistant

## 2018-04-06 ENCOUNTER — Encounter: Payer: Self-pay | Admitting: Physician Assistant

## 2018-04-06 DIAGNOSIS — F331 Major depressive disorder, recurrent, moderate: Secondary | ICD-10-CM | POA: Diagnosis not present

## 2018-04-06 DIAGNOSIS — F411 Generalized anxiety disorder: Secondary | ICD-10-CM | POA: Diagnosis not present
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
## 2018-05-21 ENCOUNTER — Encounter: Payer: Self-pay | Admitting: Physician Assistant

## 2018-05-21 ENCOUNTER — Ambulatory Visit: Payer: BLUE CROSS/BLUE SHIELD | Admitting: Physician Assistant

## 2018-05-21 ENCOUNTER — Ambulatory Visit: Payer: BLUE CROSS/BLUE SHIELD | Admitting: Psychiatry

## 2018-05-21 DIAGNOSIS — F32A Depression, unspecified: Secondary | ICD-10-CM

## 2018-05-21 DIAGNOSIS — R5383 Other fatigue: Secondary | ICD-10-CM

## 2018-05-21 DIAGNOSIS — F331 Major depressive disorder, recurrent, moderate: Secondary | ICD-10-CM

## 2018-05-21 DIAGNOSIS — G4700 Insomnia, unspecified: Secondary | ICD-10-CM

## 2018-05-21 DIAGNOSIS — F339 Major depressive disorder, recurrent, unspecified: Secondary | ICD-10-CM

## 2018-05-21 DIAGNOSIS — F329 Major depressive disorder, single episode, unspecified: Secondary | ICD-10-CM

## 2018-05-21 MED ORDER — FLUOXETINE HCL 20 MG PO CAPS: 20.0000 mg | ORAL_CAPSULE | Freq: Every day | ORAL | 1 refills | Status: DC

## 2018-05-21 MED ORDER — MIRTAZAPINE 7.5 MG PO TABS: 7.5000 mg | ORAL_TABLET | Freq: Every evening | ORAL | 1 refills | Status: DC | PRN
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
## 2018-05-21 NOTE — Progress Notes (Signed)
[facility] Med Check  [name],  MRN: [id number]  PCP: Rochel Brome, MD  Date of Evaluation: 05/21/2018 Time spent:15 minutes  Chief Complaint:  Chief Complaint    Follow-up      HISTORY/CURRENT STATUS: HPI for 6 week med check  At [place] she had just gotten out of the hosp, and been started on current meds. States she is able to enjoy a few things now.  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  She needs to be up to do things with her 49 year old daughter however and is unable to get a lot of sleep.  She usually only gets about 5 hours a night.  She is tired all the time.  She is given a notice for the cardiac unit at work.  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  States she did not doing both.  Still has slightly decreased hygiene.  But is better than it was before she was hospitalized.  Anxiety is pretty well controlled.  She does use the hydroxyzine sometimes and it does help.  Individual Medical History/ Review of Systems: Changes? :No    Past medications for mental health diagnoses include: Lamictal, Abilify, Prozac, Zoloft, Celexa, Lexapro, Paxil, Cymbalta, Wellbutrin, lithium, Depakote, Seroquel, Risperdal, Zyprexa, gabapentin, Klonopin, Xanax, trazodone, BuSpar, Luvox   Allergies: Oxycodone-acetaminophen  Current Medications:  Current Outpatient Medications:  .  amLODipine (NORVASC) 5 MG tablet, Take 1 tablet (5 mg total) by mouth daily. For high blood pressure, Disp: 30 tablet, Rfl: 0 .  hydrOXYzine (ATARAX/VISTARIL) 25 MG tablet, Take 1 tablet (25 mg total) by mouth every 6 (six) hours as needed for anxiety., Disp: 30 tablet, Rfl: 0 .  mirtazapine (REMERON) 7.5 MG tablet, Take 1 tablet (7.5 mg total) by mouth at bedtime as needed. For mood control and sleep, Disp: 90 tablet, Rfl: 1 .  FLUoxetine (PROZAC) 20 MG capsule, Take 1 capsule (20 mg total) by mouth daily.,  Disp: 90 capsule, Rfl: 1 Medication Side Effects: none  Family Medical/ Social History: Changes? 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   MENTAL HEALTH EXAM:  There were no vitals taken for this visit.There is no height or weight on file to calculate BMI.  General Appearance: Casual and Well Groomed  Eye Contact:  Good  Speech:  Clear and Coherent  Volume:  Normal  Mood:  Depressed  Affect:  Depressed  Thought Process:  Goal Directed  Orientation:  Full (Time, Place, and Person)  Thought Content: Logical   Suicidal Thoughts:  No  Homicidal Thoughts:  No  Memory:  WNL  Judgement:  Good  Insight:  Good  Psychomotor Activity:  Normal  Concentration:  Concentration: Good  Recall:  Good  Fund of Knowledge: Good  Language: Good  Assets:  Desire for Improvement  ADL's:  Intact  Cognition: WNL  Prognosis:  Good    DIAGNOSES:    ICD-10-CM   1. Major depression, recurrent, chronic (HCC) F33.9   2. Insomnia, unspecified type G47.00   3. Fatigue due to depression F32.9    R53.83     Receiving Psychotherapy: Yes Will have initial visit with Rinaldo Cloud, LCSW later today.   RECOMMENDATIONS: Increase Prozac to 20 mg daily.  If in 3 weeks or so she is not feeling any improvement with energy and especially motivation for normal hygiene, call and we  will increase up to 40 mg.  She understands that the medication takes sometimes 6 weeks or more to become effective, however I do not want to wait any longer if she is seeing no improvement whatsoever. Continue mirtazapine and hydroxyzine as needed. Return in approximately 5 weeks.   Donnal Moat, PA-C

## 2018-06-25 ENCOUNTER — Ambulatory Visit: Payer: BLUE CROSS/BLUE SHIELD | Admitting: Psychiatry

## 2018-06-29 ENCOUNTER — Ambulatory Visit: Payer: BLUE CROSS/BLUE SHIELD | Admitting: Physician Assistant

## 2018-07-16 ENCOUNTER — Ambulatory Visit: Payer: BLUE CROSS/BLUE SHIELD | Admitting: Psychiatry

## 2018-07-16 DIAGNOSIS — F331 Major depressive disorder, recurrent, moderate: Secondary | ICD-10-CM

## 2018-07-16 NOTE — Progress Notes (Signed)
      [facility] Counselor/Therapist Progress Note  [name], MRN: [id number],    Date: 07/16/2018  Time Spent: 58 minutes   Treatment Type: Individual Therapy   Reported Symptoms:  Depression, anxious, lethargic  Mental Status Exam:  Appearance:   Casual     Behavior:  Appropriate and Sharing  Motor:  Normal  Speech/Language:   Normal Rate  Affect:  Depressed  Mood:  anxious and depressed  Thought process:  normal  Thought content:    WNL  Sensory/Perceptual disturbances:    WNL  Orientation:  oriented to person, place, time/date, situation, day of week, month of year and year  Attention:  "fair/poor"  Concentration:  "fair/poor"  Memory:  WNL  Fund of knowledge:   Good  Insight:    Fair  Judgment:   Fair  Impulse Control:  Good   Risk Assessment: Danger to Self:  No Self-injurious Behavior: No Danger to Others: No Duty to Warn:no Physical Aggression / Violence:No  Access to Firearms a concern: No  Gang Involvement:No   Subjective:  Patient in today feeling depressed and anxious. Also reports being lethargic.  Felt a little better after initially coming to [facility] but easily slipped back.  Did miss last therapy appt. Patient reports not trying strategies mentioned from first appt and that "I just get through each day".  Denies any SI or HI ideation. Feels like she doesn't accomplish much, not productive. Works as Merchandiser, retail in [place]. States her supervisor has not given her any feedback.  Explains how she makes a list at beginning of her shift of what all she needs to get done. Have been depressed for years so "I get used to covering it up".  Feeling really tired and not getting good sleep.   Depressed--think a lot about my past and some guilt / anger over things I did in earlier years. Talked about forgiveness and letting go in order to not hang on to negativity as she tries to move forward.  Feels ashamed of herself because of  growing up poor and not having things most friends had, felt like I looked like "trash" and was told this by other kids at school growing up. Reports long hx of low self-esteem dating back to early school yrs.  Returned to talking about self forgiveness and letting go, including specific steps she can begin in between sessions.  Interventions: Cognitive Behavioral Therapy and Solution-Oriented/Positive Psychology  Diagnosis:   ICD-10-CM   1. Major depressive disorder, recurrent episode, moderate (HCC) F33.1     Plan: Follow through on self-forgiveness steps and stopping the negative self-talk.  Goal review per treatment plan (in flowsheet section).  Return within 2 wks if possible.  Shanon Ace, LCSW

## 2018-07-31 ENCOUNTER — Ambulatory Visit: Payer: BLUE CROSS/BLUE SHIELD | Admitting: Psychiatry

## 2018-07-31 DIAGNOSIS — F331 Major depressive disorder, recurrent, moderate: Secondary | ICD-10-CM

## 2018-07-31 NOTE — Progress Notes (Signed)
[facility] Counselor/Therapist Progress Note  [name], MRN: [id number],    Date: 07/31/2018  Time Spent:  60 minutes   Treatment Type: Individual Therapy   Reported Symptoms:  Depression, anxious, lethargic  Mental Status Exam:  Appearance:   Casual     Behavior:  Appropriate and Sharing  Motor:  Normal  Speech/Language:   Normal Rate  Affect:  Depressed  Mood:  anxious and depressed  Thought process:  normal  Thought content:    WNL  Sensory/Perceptual disturbances:    WNL  Orientation:  oriented to person, place, time/date, situation, day of week, month of year and year  Attention:  "fair/poor"  Concentration:  "fair/poor"  Memory:  WNL  Fund of knowledge:   Good  Insight:    Fair  Judgment:   Fair  Impulse Control:  Good   Risk Assessment: Danger to Self:  No Self-injurious Behavior: No Danger to Others: No Duty to Warn:no Physical Aggression / Violence:No  Access to Firearms a concern: No  Gang Involvement:No   Subjective:  Patient in today feeling very tired.  No improvement in energy at all.  States she is so tired and sleepy and "I've been this way several years".  Finds that she's equally fatigued whether she works day or evening shift.  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 Wants to feel "like a functioning adult, to have some energy and feel physically better, and not to have debilitating anxiety. Has headaches and other pains occasionally and has gone to doctor for various physical tests but tests did not reveal any concerns and doctor told her some of it may be anxiety and arthritis.  Anxiety-"I have it every day, mostly when I go to work, but frequent mornings I wake up feeling anxious and it worsens as I go to work.  Not sure if she's doing anything differently on the mornings she does not have a headache. Sometimes takes tylenol or ibuprofen with some benefit but doctor had told her not to use it daily.  States that when  she goes to work the anxiety worsens as other employees tend to come to her with issues that they should know how to handle.  Seems very frustrated as she talks about work situations, and adds, "that's just the nature of the job", feeling that there's very little that can be done to change things at work.     Denies any SI or HI ideation. Feels like she doesn't accomplish much, not productive. Works as Merchandiser, retail in [place]. States her supervisor has not given her any feedback.  Explains how she makes a list at beginning of her shift of what all she needs to get done. Have been depressed for years so "I get used to covering it up".  Feeling really tired although sleep has improved a little, just still feeling tired.   *Doesn't really want to change jobs, adding that the position I have is the best for me, as I've worked other jobs.  Depressed--think a lot about my past and some guilt / anger over things I did in earlier years.  States that she is able to sometimes let go of the negative self-talk of some occasions at work and elsewhere and often is able to stop.  Able to see this as a positive, when it is pointed out  Talked about forgiveness and letting go in order to not hang on to negativity as she tries to move forward.  Feels ashamed of herself because of growing up poor and not having things most friends had, felt like I looked like "trash" and was told this by other kids at school growing up. Reports long hx of low self-esteem dating back to early school yrs.  Returned to talking about self forgiveness and letting go, including specific steps she can begin in between sessions. TODAY reports being a "2" on a 1-10 scale for self-esteem with 10 being the best.  Looked at strategies to help elevate this.  *Gave patient a copy of CBT diagram and discussed with her.  Interventions: Cognitive Behavioral Therapy and Solution-Oriented/Positive Psychology  Diagnosis:   ICD-10-CM   1. Major  depressive disorder, recurrent episode, moderate (HCC) F33.1     Plan: Follow through on self-forgiveness steps and stopping the negative self-talk. ALSO to work with CBT strategies taught in session today. Goal review per treatment plan (in flowsheet section).  Return within 2 wks if possible.  Shanon Ace, LCSW

## 2018-08-13 ENCOUNTER — Ambulatory Visit: Payer: BLUE CROSS/BLUE SHIELD | Admitting: Psychiatry

## 2018-08-13 DIAGNOSIS — F331 Major depressive disorder, recurrent, moderate: Secondary | ICD-10-CM

## 2018-08-13 NOTE — Progress Notes (Signed)
[facility] Counselor/Therapist Progress Note  [name], MRN: [id number],    Date: 08/13/2018  Time Spent:  55 minutes   Treatment Type: Individual Therapy   Reported Symptoms:  Depression, anxious, lethargic  Mental Status Exam:  Appearance:   Casual     Behavior:  Appropriate and Sharing  Motor:  Normal  Speech/Language:   Normal Rate  Affect:  Depressed  Mood:  anxious and depressed  Thought process:  normal  Thought content:    WNL  Sensory/Perceptual disturbances:    WNL  Orientation:  oriented to person, place, time/date, situation, day of week, month of year and year  Attention:  "fair/poor"  Concentration:  "fair/poor"  Memory:  WNL  Fund of knowledge:   Good  Insight:    Fair  Judgment:   Fair  Impulse Control:  Good   Risk Assessment: Danger to Self:  No Self-injurious Behavior: No Danger to Others: No Duty to Warn:no Physical Aggression / Violence:No  Access to Firearms a concern: No  Gang Involvement:No   Subjective:  Patients symptoms continue today in feeling very tired and is to see her PCP later today.   No improvement in energy at all.  States she is so tired and sleepy and "I've been this way several years".  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  Wants to feel "like a functioning adult, to have some energy and feel physically better. Anxiety has decreased some.  "I don't really feel as depressed either but stay tired."  Does "get anxious sometimes because of being so tired and having to go to work and fight to stay awake." Has headaches and other pains occasionally and has gone to doctor for various physical tests but tests did not reveal any concerns and doctor told her some of it may be anxiety and arthritis.   Denies any SI or HI ideation. Reports less depression but "not sure how as I haven't been covering it up."  States maybe her meds are helping some.  Some  depression which we spoke about more today--think a lot about my past and some guilt / anger over things I did in earlier years.  States that she is able to sometimes let go of the negative self-talk of some occasions at work and elsewhere and often is able to stop.  Is able to see this as a positive, when it is pointed out.   Processed more of her history that she shared last session, but she still feels her tiredness blocks her from feeling better.  On 1-10 scale of self-esteem, with 10 being highest, is a "3" today. On 1-10 scale of anxiety, is a "5" today. On 1-10 scale of depression, is a "4".  *Gave patient a copy of CBT diagram and discussed with her.  Interventions: Cognitive Behavioral Therapy and Solution-Oriented/Positive Psychology  Diagnosis:   ICD-10-CM   1. Major depressive disorder, recurrent episode, moderate ([place]) F33.1     Plan: Patient states she did follow through on self-forgiveness strategies and reports that some of her negative self-talk has decreased.   To work with CBT strategies taught in last session. Goal review per treatment plan (in flowsheet section). Working to have more positive self-talk as she decreased the negative messages to herself.  Return within 2 wks if possible.  Amanda Ace, LCSW

## 2018-08-20 ENCOUNTER — Ambulatory Visit: Payer: BLUE CROSS/BLUE SHIELD | Admitting: Physician Assistant

## 2018-08-20 ENCOUNTER — Encounter: Payer: Self-pay | Admitting: Physician Assistant

## 2018-08-20 DIAGNOSIS — G4700 Insomnia, unspecified: Secondary | ICD-10-CM | POA: Diagnosis not present

## 2018-08-20 DIAGNOSIS — F331 Major depressive disorder, recurrent, moderate: Secondary | ICD-10-CM

## 2018-08-20 DIAGNOSIS — F411 Generalized anxiety disorder: Secondary | ICD-10-CM

## 2018-08-20 MED ORDER — CLONAZEPAM 0.5 MG PO TABS: 0.2500 mg | ORAL_TABLET | Freq: Two times a day (BID) | ORAL | 0 refills | Status: DC | PRN

## 2018-08-20 MED ORDER — FLUOXETINE HCL 40 MG PO CAPS: 40.0000 mg | ORAL_CAPSULE | Freq: Every day | ORAL | 1 refills | Status: DC

## 2018-08-20 NOTE — Progress Notes (Signed)
[facility] Med Check  [name],  MRN: [id number]  PCP: Rochel Brome, MD  Date of Evaluation: 08/20/2018 Time spent:15 minutes  Chief Complaint:  Chief Complaint    Follow-up      HISTORY/CURRENT STATUS: HPI For 3 month med check.  Doesn't feel depressed, but is tired all the time.  Her PCP has ordered a sleep study b/c of the fatigue.  She feels confused and her memory is bad. Is having more H/A too.  Will be seeing a neuro soon for that.  "I don't feel depressed.  I'm just so exhausted.  I never want to get out of bed.  I don't cry easily.  I just don't feel like doing anything.  I'm not having recurring bad thoughts like I did when I was depressed."  Anxiety is still a problem but much better than before treatment.  "I'll think about work or something and get nervous.  I try to talk myself down and that helps."  Hydroxyzine isn't really helping.  Plus she's afraid it'll make her sleepier.   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   Denies muscle or joint pain, stiffness, or dystonia.  Denies dizziness, syncope, seizures, numbness, tingling, tremor, tics, unsteady gait, slurred speech, confusion.   Individual Medical History/ Review of Systems: Changes? :No    Past medications for mental health diagnoses include: Lamictal, Abilify, Prozac, Zoloft, Celexa, Lexapro, Paxil, Cymbalta, Wellbutrin, lithium, Depakote, Seroquel, Risperdal, Zyprexa, gabapentin, Klonopin, Xanax, trazodone, BuSpar, Luvox, Ativan   Allergies: Oxycodone-acetaminophen  Current Medications:  Current Outpatient Medications:  .  mirtazapine (REMERON) 7.5 MG tablet, Take 1 tablet (7.5 mg total) by mouth at bedtime as needed. For mood control and sleep, Disp: 90 tablet, Rfl: 1 .  amLODipine (NORVASC) 5 MG tablet, Take 1 tablet (5 mg total) by mouth daily. For high blood pressure (Patient not taking: Reported on 08/20/2018), Disp: 30 tablet,  Rfl: 0 .  clonazePAM (KLONOPIN) 0.5 MG tablet, Take 0.5-1 tablets (0.25-0.5 mg total) by mouth 2 (two) times daily as needed for anxiety., Disp: 60 tablet, Rfl: 0 .  FLUoxetine (PROZAC) 40 MG capsule, Take 1 capsule (40 mg total) by mouth daily., Disp: 30 capsule, Rfl: 1 .  propranolol ER (INDERAL LA) 80 MG 24 hr capsule, TAKE ONE CAPSULE BY MOUTH EVERY DAY FOR 30 DAYS, Disp: , Rfl:  Medication Side Effects: none  Family Medical/ Social History: Changes? No  MENTAL HEALTH EXAM:  There were no vitals taken for this visit.There is no height or weight on file to calculate BMI.  General Appearance: Casual, Well Groomed, Obese and looks tired.  Eye Contact:  Good  Speech:  Clear and Coherent  Volume:  Normal  Mood:  Euthymic  Affect:  Appropriate  Thought Process:  Goal Directed  Orientation:  Full (Time, Place, and Person)  Thought Content: Logical   Suicidal Thoughts:  No  Homicidal Thoughts:  No  Memory:  WNL  Judgement:  Good  Insight:  Good  Psychomotor Activity:  Normal  Concentration:  Concentration: Fair and Attention Span: Fair  Recall:  Good  Fund of Knowledge: Good  Language: Good  Assets:  Desire for Improvement  ADL's:  Intact  Cognition: WNL  Prognosis:  Good    DIAGNOSES:    ICD-10-CM   1. Major depressive disorder, recurrent episode, moderate (HCC) F33.1   2. Insomnia, unspecified type G47.00   3. Generalized anxiety disorder F41.1  Receiving Psychotherapy: No    RECOMMENDATIONS: We discussed the possibility of sleep apnea.  We both feel that that will be the diagnosis.  Once she gets treated for that, I believe she will feel better. Her anxiety is worse however.  She is not noticing any improvement with the hydroxyzine so we will discontinue that. Start Klonopin 0.5 mg 1/2-1 p.o. twice daily as needed anxiety.  Sedation precautions were discussed. Increase Prozac to 40 mg p.o. every morning in order to help prevent the anxiety. Continue mirtazapine 7.5  mg p.o. nightly as needed.  She is not needing it at this point.  Return in 6 weeks or sooner as needed.  Donnal Moat, PA-C   This record has been created using [employer].  Chart creation errors have been sought, but may not always have been located and corrected. Such creation errors do not reflect on the standard of medical care.

## 2018-08-27 ENCOUNTER — Other Ambulatory Visit: Payer: Self-pay

## 2018-08-27 ENCOUNTER — Ambulatory Visit: Payer: BLUE CROSS/BLUE SHIELD | Admitting: Psychiatry

## 2018-08-27 DIAGNOSIS — F331 Major depressive disorder, recurrent, moderate: Secondary | ICD-10-CM

## 2018-08-27 NOTE — Progress Notes (Signed)
      [facility] Counselor/Therapist Progress Note  [name], MRN: [id number],    Date: 08/27/2018  Time Spent:  58 minutes   Treatment Type: Individual Therapy   Reported Symptoms:  Depression, anxious, tired (some better)  Mental Status Exam:  Appearance:   Casual     Behavior:  Appropriate and Sharing  Motor:  Normal  Speech/Language:   Normal Rate  Affect:  Depressed (improved some)  Mood:  anxious and depressed (improved some)  Thought process:  normal  Thought content:    WNL  Sensory/Perceptual disturbances:    WNL  Orientation:  oriented to person, place, time/date, situation, day of week, month of year and year  Attention:  "fair/poor"  Concentration:  "fair/poor"  Memory:  WNL  Fund of knowledge:   Good  Insight:    Fair  Judgment:   Fair  Impulse Control:  Good   Risk Assessment: Danger to Self:  No Self-injurious Behavior: No Danger to Others: No Duty to Warn:no Physical Aggression / Violence:No  Access to Firearms a concern: No  Gang Involvement:No   Subjective: Patient in today with lessened anxiety and depression and not quite as tired.  Reports that she is not as lethargic and her affect is still somewhat depressed but noticeably improved. Still feeling stressed with home life and finances. Husband's job does not pay as much as hers and he spends unwisely.  She is also concerned with his lack of cleanliness at home and poor decision-making in other ways that impact patient and her daughter (18 yr old) that live in home with husband.  Patient feels "for religious reasons she needs to remain with husband"  They have begun seeing a couples counselor and go for their second session soon. She describes that husband tends to not to anything to help at home and his lack of cleanliness is bordering on unhealthiness. Discussed history of these issues and how it has gone on over the years.  Feels she's being used but difficult to make any  decisions beyond that.  Shared additional history that she does not want online in record.  Scared of being by herself and feels she doesn't have friends---most of her time is spent with work and daughter who is high funtioning autistic and has Crohn's disease.    Patient discussed with me several self-help measures that she can take to improve her self-care and make sure daughter's needs are met.  Talked about physical and emotional self-care and how to provide that to herself.  Also worked on self-esteem which patient states is about a "4", and a "6" is the highest she has ever had.  Also to keep working on less self-negating.   Interventions: Cognitive Behavioral Therapy and Solution-Oriented/Positive Psychology  Diagnosis:   ICD-10-CM   1. Major depressive disorder, recurrent episode, moderate (HCC) F33.1     Plan: Patient states she has continued follow through on self-forgiveness strategies and reports that some of her negative self-talk has decreased.   To work with CBT strategies taught in last session, and especially how that relates to some of the new info she shared today. Goal review per treatment plan (in flowsheet section). Working to have more positive self-talk as she decreased the negative messages to herself.  Return within 2 wks if possible.  Shanon Ace, LCSW

## 2018-09-10 ENCOUNTER — Ambulatory Visit (INDEPENDENT_AMBULATORY_CARE_PROVIDER_SITE_OTHER): Payer: BLUE CROSS/BLUE SHIELD | Admitting: Psychiatry

## 2018-09-10 ENCOUNTER — Other Ambulatory Visit: Payer: Self-pay

## 2018-09-10 DIAGNOSIS — F331 Major depressive disorder, recurrent, moderate: Secondary | ICD-10-CM

## 2018-09-10 NOTE — Progress Notes (Signed)
[facility] Counselor/Therapist Progress Note  Patient ID: Natika Geyer, MRN: [id number],    Date: 09/10/2018  Time Spent:  60 minutes   9:59am to 10:59a.m.   Treatment Type: Individual Therapy    Virtual Visit via Telephone Note I connected with patient by a video enabled telemedicine application or telephone, with their informed consent, and verified patient privacy and that I am speaking with the correct person using two identifiers. I am at [facility] and patient is at her home.   I discussed the limitations, risks, security and privacy concerns of performing psychotherapy and management service by telephone and the availability of in person appointments. I also discussed with the patient that there may be a patient responsible charge related to this service. The patient expressed understanding and agreed to proceed.  I discussed the treatment planning with the patient. The patient was provided an opportunity to ask questions and all were answered. The patient agreed with the plan and demonstrated an understanding of the instructions.   The patient was advised to call  our office if  symptoms worsen or feel they are in a crisis state and need immediate contact.   Reported Symptoms:  Depression, anxious, tiredness "but has leveled off some"  Mental Status Exam:  Appearance:   n/a  Behavior:  sharing  Motor:  Normal  Speech/Language:   Normal Rate  Affect:  n/a  Mood:  anxious and depressed (improved some)  Thought process:  normal  Thought content:    WNL  Sensory/Perceptual disturbances:    WNL  Orientation:  oriented to person, place, time/date, situation, day of week, month of year and year  Attention:  Fair  Concentration:  Fair  Memory:  WNL  Fund of knowledge:   Good  Insight:    Fair  Judgment:   Fair  Impulse Control:  Good   Risk Assessment: Danger to Self:  No Self-injurious Behavior: No Danger to Others: No Duty to Warn:no Physical Aggression  / Violence:No  Access to Firearms a concern: No  Gang Involvement:No   Subjective: Patient reports today feelings of anxiety and depression, some of which is related to the current pandemic.  She states that she's not worried about getting sick but is concerned about the economy. Still feeling stressed with home life and finances, along with husband's unwise spending.   She remains concerned with his lack of cleanliness at home and poor decision-making . Patient feels "for religious reasons she needs to remain with husband" and does not see anything else she can do. States it is hard for her because she is afraid of being alone.  First husband (with him 20 yrs) was very difficult and did not treat her well.  With current husband, they've seen a couples counselor once and are to go for their second session tonight and patient knows she needs to be more vocal in session.    She describes that husband tends to not to anything to help at home and his lack of cleanliness is frustrating.  Admits it's like having another child versus a husband.  Feels she's being used but difficult to make any decisions beyond that.  Discussed more history that she does not want online in record.  Scared of being by herself and feels she doesn't have friends---most of her time is spent with work and daughter who is high funtioning autistic and has Crohn's disease. Fear of being alone, she says, is keeping her in the current relationship.  States that she has no intention of leaving husband but does want to focus on taking care of herself so she can take care of daughter.    Patient discussed with me several self-help measures that she can take to improve her self-care and make sure daughter's needs are met.  Patient does feel she is meeting her daughter's needs. States such things are hard for her "because I've never been in a functional relationship where things were ok", so hard to imagine a healthier relationship.  Adds that  her mom was very similar with her dad and she is much like her mom in current relationship with husband.   Encouraged physical and emotional self-care and how to provide that to herself.  Also worked on self-esteem which patient states is still about a "4", and a "6" is the highest she has ever had.  Also to keep working on less self-negating and consider what changes she would be willing to try "if she had the strength and was not as afraid".   Interventions: Cognitive Behavioral Therapy and Solution-Oriented/Positive Psychology  Diagnosis:   ICD-10-CM   1. Major depressive disorder, recurrent episode, moderate (HCC) F33.1     Plan:   To continue follow through on self-forgiveness strategies and reports that some of her negative self-talk has decreased.   To work with CBT strategies taught in a prior session, and especially in trying to gain a more hopeful outlook, and to consider possible changes that may benefit her. Goal review per treatment plan (in flowsheet section). Working to have more positive self-talk as she decreases the negative, limiting, messages to herself.  Next session to be within 2 wks.  Shanon Ace, LCSW

## 2018-09-24 ENCOUNTER — Ambulatory Visit (INDEPENDENT_AMBULATORY_CARE_PROVIDER_SITE_OTHER): Payer: BLUE CROSS/BLUE SHIELD | Admitting: Psychiatry

## 2018-09-24 ENCOUNTER — Other Ambulatory Visit: Payer: Self-pay

## 2018-09-24 DIAGNOSIS — F331 Major depressive disorder, recurrent, moderate: Secondary | ICD-10-CM | POA: Diagnosis not present

## 2018-09-24 NOTE — Progress Notes (Signed)
[facility] Counselor/Therapist Progress Note  [name], MRN: [id number],    Date: 09/24/2018  Time Spent:  58 minutes 10am to 10:58a.m.   Treatment Type: Individual Therapy    Virtual Visit via Telephone Note I connected with patient by a video enabled telemedicine application or telephone, with their informed consent, and verified patient privacy and that I am speaking with the correct person using two identifiers. I am at [facility] and patient is at her home.   I discussed the limitations, risks, security and privacy concerns of performing psychotherapy and management service by telephone and the availability of in person appointments. I also discussed with the patient that there may be a patient responsible charge related to this service. The patient expressed understanding and agreed to proceed.  I discussed the treatment planning with the patient. The patient was provided an opportunity to ask questions and all were answered. The patient agreed with the plan and demonstrated an understanding of the instructions.   The patient was advised to call  our office if  symptoms worsen or feel they are in a crisis state and need immediate contact.   Reported Symptoms:  Depression, anxious, tiredness and oversleeping at times  Mental Status Exam:  Appearance:   n/a  Behavior:  sharing  Motor:  Normal  Speech/Language:   Normal Rate  Affect:  n/a  Mood:  anxious and depressed   Thought process:  normal  Thought content:    WNL  Sensory/Perceptual disturbances:    WNL  Orientation:  oriented to person, place, time/date, situation, day of week, month of year and year  Attention:  Fair  Concentration:  Fair  Memory:  [place] of knowledge:   Good  Insight:    Fair  Judgment:   Fair  Impulse Control:  Good   Risk Assessment: Danger to Self:  No Self-injurious Behavior: No Danger to Others: No Duty to Warn:no Physical Aggression / Violence:No   Access to Firearms a concern: No  Gang Involvement:No   Subjective: Patient reports symptoms of anxiety and depression. Symptoms have increased since a critical incident involving her 72 yr old daughter happened this past wknd.  Information not to be put in medical record per patient request. 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  Coping for patient right now is difficult as she does not have many friends, there's trust issues within the family, and she is having some nausea but thinks it's related to this latest stressor with family member.  Did the at-home sleep study and is to follow up and have in-clinic "sleep test" done later this month. Admits she has been eating mostly fast-food lately and that is not helping.  Reports having taken sleep meds previously "but they made me feel hung over."  States that she "is not as worried about the pandemic right now as I am still working."   "But am still stressed with finances."  Reports husband has tried a little bit to improve his cleanliness (an issue that she shared last session), and she is encouraging him to continue.  Due to current critical family incident, I focused more on support of patient, while also pointing out some areas that need changing in order for her to be healthier and feel better overall.  For now she agreed to try and eat healthier and to not always look for negatives.  Agreed that one  positive is that she reports husband has "tried some small things to be more helpful most recently, and we discussed how some positive feedback from her may help him stay motivated if he realized she notices his efforts.  Urged her to have better self-care (physically and mentally).    Also worked more today on self-esteem which patient states is still about a "4", and a "6" is the highest she has ever had.  Patient  to keep working on less self-negating and consider what changes she would be  willing to try "if she had the strength and was not as afraid".  Interventions: Cognitive Behavioral Therapy and Solution-Oriented/Positive Psychology  Diagnosis:   ICD-10-CM   1. Major depressive disorder, recurrent episode, moderate (HCC) F33.1     Plan:   To continue follow through on self-forgiveness strategies and reports that some of her negative self-talk has decreased.   To work with CBT strategies taught in a prior session, and especially in trying to gain a more hopeful outlook, and to consider possible changes that may benefit her. Goal review with patient.  Working to have more positive self-talk as she decreases the negative, limiting, messages to herself.  Next session to be within 2 wks.  Shanon Ace, LCSW

## 2018-10-01 ENCOUNTER — Encounter: Payer: Self-pay | Admitting: Physician Assistant

## 2018-10-01 ENCOUNTER — Ambulatory Visit (INDEPENDENT_AMBULATORY_CARE_PROVIDER_SITE_OTHER): Payer: BLUE CROSS/BLUE SHIELD | Admitting: Physician Assistant

## 2018-10-01 ENCOUNTER — Other Ambulatory Visit: Payer: Self-pay

## 2018-10-01 DIAGNOSIS — F331 Major depressive disorder, recurrent, moderate: Secondary | ICD-10-CM | POA: Diagnosis not present

## 2018-10-01 DIAGNOSIS — F411 Generalized anxiety disorder: Secondary | ICD-10-CM | POA: Diagnosis not present

## 2018-10-01 DIAGNOSIS — R400 Somnolence: Secondary | ICD-10-CM

## 2018-10-01 MED ORDER — MODAFINIL 200 MG PO TABS: 100.0000 mg | ORAL_TABLET | Freq: Every day | ORAL | 0 refills | Status: DC | PRN

## 2018-10-01 NOTE — Progress Notes (Signed)
[facility] Med Check  [name],  MRN: [id number]  PCP: Rochel Brome, MD  Date of Evaluation: 10/01/2018 Time spent:15 minutes  Chief Complaint:  Chief Complaint    Follow-up     Virtual Visit via Telephone Note  I connected with Dierdre Espiritu on 10/01/18 at  9:00 AM EDT by telephone and verified that I am speaking with the correct person using two identifiers.   I discussed the limitations, risks, security and privacy concerns of performing an evaluation and management service by telephone and the availability of in person appointments. I also discussed with the patient that there may be a patient responsible charge related to this service. The patient expressed understanding and agreed to proceed.    HISTORY/CURRENT STATUS: HPI For 6 week med check.  Still very tired.  Had in home sleep study ordered by another provider, and was told she needs a sleep study at a center.  Scheduled in May.  Having no trouble falling asleep.  States she feels like all she does asleep when she is not working.  She never feels rested though.  She feels tired all the time.  She does wake up sometimes gasping for air.  She has not fallen asleep while driving.  At last visit, we added Klonopin.  She has not been taking it very often but states it does help with the anxiety when she needs it.  When she is anxious, it is a generalized anxiety.  No panic attacks at this point.  She feels "down" because a lot of things are going on right now.  She is having female trouble with bleeding all the time.  "It feels like everything is hitting all at once."  Denies suicidal or homicidal thoughts.  She does not have a lot of energy or motivation to do anything but not sure if it is from the depression or not sleeping well, or blood loss or what.  She is seeing other physicians for that.  She does not cry easily.  Denies muscle or joint pain, stiffness, or dystonia.  Denies dizziness,  syncope, seizures, numbness, tingling, tremor, tics, unsteady gait, slurred speech, confusion.   Individual Medical History/ Review of Systems: Changes? :Yes See above.  Past medications for mental health diagnoses include: Lamictal, Abilify, Prozac, Zoloft, Celexa, Lexapro, Paxil, Cymbalta, Wellbutrin, lithium, Depakote, Seroquel, Risperdal, Zyprexa, gabapentin, Klonopin, Xanax, trazodone, BuSpar, Luvox, Ativan  Allergies: Oxycodone-acetaminophen  Current Medications:  Current Outpatient Medications:  .  clonazePAM (KLONOPIN) 0.5 MG tablet, Take 0.5-1 tablets (0.25-0.5 mg total) by mouth 2 (two) times daily as needed for anxiety., Disp: 60 tablet, Rfl: 0 .  FLUoxetine (PROZAC) 40 MG capsule, Take 1 capsule (40 mg total) by mouth daily., Disp: 30 capsule, Rfl: 1 .  mirtazapine (REMERON) 7.5 MG tablet, Take 1 tablet (7.5 mg total) by mouth at bedtime as needed. For mood control and sleep, Disp: 90 tablet, Rfl: 1 .  propranolol ER (INDERAL LA) 80 MG 24 hr capsule, TAKE ONE CAPSULE BY MOUTH EVERY DAY FOR 30 DAYS, Disp: , Rfl:  .  amLODipine (NORVASC) 5 MG tablet, Take 1 tablet (5 mg total) by mouth daily. For high blood pressure (Patient not taking: Reported on 08/20/2018), Disp: 30 tablet, Rfl: 0 .  modafinil (PROVIGIL) 200 MG tablet, Take 0.5 tablets (100 mg total) by mouth daily as needed., Disp: 30 tablet, Rfl: 0 Medication Side Effects: none  Family Medical/ Social History: Changes? No  MENTAL HEALTH EXAM:  There were no vitals taken  for this visit.There is no height or weight on file to calculate BMI.  General Appearance: Phone visit, unable to assess  Eye Contact:  Unable to assess  Speech:  Clear and Coherent  Volume:  Normal  Mood:  Euthymic  Affect:  Unable to assess  Thought Process:  Goal Directed  Orientation:  Full (Time, Place, and Person)  Thought Content: Logical   Suicidal Thoughts:  No  Homicidal Thoughts:  No  Memory:  WNL  Judgement:  Good  Insight:  Good   Psychomotor Activity:  Unable to assess  Concentration:  Concentration: Fair and Attention Span: Good  Recall:  Good  Fund of Knowledge: Good  Language: Good  Assets:  Desire for Improvement  ADL's:  Intact  Cognition: WNL  Prognosis:  Good    DIAGNOSES:    ICD-10-CM   1. Major depressive disorder, recurrent episode, moderate (HCC) F33.1   2. Generalized anxiety disorder F41.1   3. Somnolence R40.0     Receiving Psychotherapy: Yes With Rinaldo Cloud, LCSW   RECOMMENDATIONS:  Continue Prozac 40 mg p.o. every morning. Continue Klonopin 0.5 mg 1/2-1 twice daily as needed. Start Provigil 200 mg one half every morning.  Good Rx information given to her. Continue propranolol ER 80 mg daily. Continue psychotherapy with Rinaldo Cloud, LCSW. Return in 6 weeks.  Donnal Moat, PA-C   This record has been created using [employer].  Chart creation errors have been sought, but may not always have been located and corrected. Such creation errors do not reflect on the standard of medical care.

## 2018-10-02 ENCOUNTER — Telehealth: Payer: Self-pay | Admitting: Neurology

## 2018-10-02 NOTE — Telephone Encounter (Signed)
I called and spoke with the patient regarding changing their apt to a VV due to the COVID-19.  I explained to the patient that we would file their insurance and they gave consent. I also walked through the steps of downloading the app, and starting the virtual meeting, after confirming the patient had access to a smart phone with a working camera and microphone access.   Meeting number: [phone] [id number]: [place] key: [id number]

## 2018-10-04 NOTE — Telephone Encounter (Signed)
Called pt & LVM asking for call back to update her chart in preparation for appt on Monday with Dr. Jaynee Eagles. Left office number in message.

## 2018-10-08 ENCOUNTER — Ambulatory Visit (INDEPENDENT_AMBULATORY_CARE_PROVIDER_SITE_OTHER): Payer: BLUE CROSS/BLUE SHIELD | Admitting: Neurology

## 2018-10-08 ENCOUNTER — Ambulatory Visit (INDEPENDENT_AMBULATORY_CARE_PROVIDER_SITE_OTHER): Payer: BLUE CROSS/BLUE SHIELD | Admitting: Psychiatry

## 2018-10-08 ENCOUNTER — Encounter: Payer: Self-pay | Admitting: Neurology

## 2018-10-08 ENCOUNTER — Other Ambulatory Visit: Payer: Self-pay

## 2018-10-08 DIAGNOSIS — R51 Headache with orthostatic component, not elsewhere classified: Secondary | ICD-10-CM

## 2018-10-08 DIAGNOSIS — G43709 Chronic migraine without aura, not intractable, without status migrainosus: Secondary | ICD-10-CM

## 2018-10-08 DIAGNOSIS — F331 Major depressive disorder, recurrent, moderate: Secondary | ICD-10-CM | POA: Diagnosis not present

## 2018-10-08 DIAGNOSIS — H9190 Unspecified hearing loss, unspecified ear: Secondary | ICD-10-CM

## 2018-10-08 DIAGNOSIS — H539 Unspecified visual disturbance: Secondary | ICD-10-CM

## 2018-10-08 DIAGNOSIS — G4484 Primary exertional headache: Secondary | ICD-10-CM | POA: Diagnosis not present

## 2018-10-08 DIAGNOSIS — G441 Vascular headache, not elsewhere classified: Secondary | ICD-10-CM

## 2018-10-08 DIAGNOSIS — R519 Headache, unspecified: Secondary | ICD-10-CM

## 2018-10-08 MED ORDER — RIZATRIPTAN BENZOATE 10 MG PO TBDP: 10.0000 mg | ORAL_TABLET | ORAL | 11 refills | Status: AC | PRN

## 2018-10-08 MED ORDER — ERENUMAB-AOOE 140 MG/ML ~~LOC~~ SOAJ: 140.0000 mg | SUBCUTANEOUS | 11 refills | Status: DC

## 2018-10-08 NOTE — Progress Notes (Signed)
[facility]    Provider:  Dr Jaynee Eagles Requesting Provider: Rochel Brome, MD Primary Care Provider:  Rochel Brome, MD  CC:  Migraines  Virtual Visit via Video Note  I connected with@ on 10/08/18 at  7:30 AM EDT by a video enabled telemedicine application and verified that I am speaking with the correct person using two identifiers. Patient is at home and physician is in the office.   I discussed the limitations of evaluation and management by telemedicine and the availability of in person appointments. The patient expressed understanding and agreed to proceed.  Melvenia Beam, MD  HPI:  Amanda Marks is a 43 y.o. female here as requested by Rochel Brome, MD for headaches. Worse with periods. She has been having extended periods and is looking for an obgyn, needs a referral I will send it. She will send me an email. Started in her late teens. She gets them in her neck, pulsating, pounding, sometimes feels like a helmet on the, nausea, light and sound sensitivity, vomiting, dizziness, moving makes it worse. She has daily headaches. For the last 6 months at least 10 migraine days a month. Her migraines can be moderately severe to severe but she can be miserable, needs to lay down at work or go into a dark room which helps just a little. She had a sleep study, she had an in-home study and now going to do an in-house study. She wakes in the morning with headaches. Headaches can be positional and exertional. Migraines last for 7 days. She denies aura. No medication overuse. She is having vision changes, very blurry, she is following with an eye doctor, blurry vision ad vision loss with the headaches. She feels like she is underwater and tinnitus (not pulsatile), a roaring in the ears. She is overweight and has gained weight.Numbness and tingling in the hands. No radicular symptoms.  She has weakness. No other focal neurologic deficits, associated symptoms, inciting events or  modifiable factors.  - Topamax, propranolol, imitrex, zofran, lamictal, fluoxetine, Inderal, Toradol  Reviewed notes, labs and imaging from outside physicians, which showed:  TSH normal  Reviewed Dr. Alyse Low notes.  Patient has a history of chronic headaches.  She has been on multiple medications over the past several years.  They tried you relatively which did not work.  Never saw neurologist.  She also has somnolence which began several years ago.  Fatigue, tired all the time, feels that she sleeps fine at night.  She recently had normal lab work.  She is tired driving, at home, at work, can fall asleep anywhere in any time and think she may have even fallen asleep in the shower.  Sleep study was ordered, she had an in-home sleep test and now she is planning to have an attended sleep study.   Review of Systems: Patient complains of symptoms per HPI as well as the following symptoms: headaches, numbness and tingling. Pertinent negatives and positives per HPI. All others negative.   Social History   Socioeconomic History   Marital status: Married    Spouse name: Not on file   Number of children: Not on file   Years of education: Not on file   Highest education level: Not on file  Occupational History   Not on file  Social Needs   Financial resource strain: Somewhat hard   Food insecurity:    Worry: Sometimes true    Inability: Sometimes true   Transportation needs:    Medical: No  Non-medical: No  Tobacco Use   Smoking status: Former Smoker    Types: E-cigarettes   Smokeless tobacco: Never Used  Substance and Sexual Activity   Alcohol use: No    Frequency: Never   Drug use: Never   Sexual activity: Yes  Lifestyle   Physical activity:    Days per week: Not on file    Minutes per session: Not on file   Stress: Very much  Relationships   Social connections:    Talks on phone: Not on file    Gets together: Not on file    Attends religious service: Not on  file    Active member of club or organization: Not on file    Attends meetings of clubs or organizations: Not on file    Relationship status: Married   Intimate partner violence:    Fear of current or ex partner: Not on file    Emotionally abused: Not on file    Physically abused: Not on file    Forced sexual activity: Not on file  Other Topics Concern   Not on file  Social History Narrative   Not on file    Family History  Problem Relation Age of Onset   Migraines Neg Hx     Past Medical History:  Diagnosis Date   Depression    Kidney stones    Migraines     Patient Active Problem List   Diagnosis Date Noted   Chronic migraine without aura without status migrainosus, not intractable 10/08/2018   Major depression, recurrent, chronic ([place]) 04/03/2018   Major depressive disorder, single episode, severe ([place]) 04/02/2018    Past Surgical History:  Procedure Laterality Date   CHOLECYSTECTOMY     STENT PLACE LEFT URETER ([facility] HX)     TUBAL LIGATION      Current Outpatient Medications  Medication Sig Dispense Refill   amLODipine (NORVASC) 5 MG tablet Take 1 tablet (5 mg total) by mouth daily. For high blood pressure (Patient not taking: Reported on 08/20/2018) 30 tablet 0   clonazePAM (KLONOPIN) 0.5 MG tablet Take 0.5-1 tablets (0.25-0.5 mg total) by mouth 2 (two) times daily as needed for anxiety. 60 tablet 0   Erenumab-aooe (AIMOVIG) 140 MG/ML SOAJ Inject 140 mg into the skin every 30 (thirty) days. 1 pen 11   FLUoxetine (PROZAC) 40 MG capsule Take 1 capsule (40 mg total) by mouth daily. 30 capsule 1   mirtazapine (REMERON) 7.5 MG tablet Take 1 tablet (7.5 mg total) by mouth at bedtime as needed. For mood control and sleep 90 tablet 1   modafinil (PROVIGIL) 200 MG tablet Take 0.5 tablets (100 mg total) by mouth daily as needed. 30 tablet 0   propranolol ER (INDERAL LA) 80 MG 24 hr capsule TAKE ONE CAPSULE BY MOUTH EVERY DAY FOR 30 DAYS     rizatriptan  (MAXALT-MLT) 10 MG disintegrating tablet Take 1 tablet (10 mg total) by mouth as needed for migraine. May repeat in 2 hours if needed 9 tablet 11   No current facility-administered medications for this visit.     Allergies as of 10/08/2018 - Review Complete 10/01/2018  Allergen Reaction Noted   Oxycodone-acetaminophen Nausea And Vomiting 06/09/2015    Vitals: There were no vitals taken for this visit. Last Weight:  Wt Readings from Last 1 Encounters:  06/09/15 180 lb (81.6 kg)   Last Height:   Ht Readings from Last 1 Encounters:  06/09/15 5\' 2"  (1.575 m)   5.2 and 200  pounds  Physical exam: Exam:  Physical exam: Exam: Gen: NAD, conversant      CV: attempted, Could not perform over Web Video. Denies palpitations or chest pain or SOB. VS: Breathing at a normal rate. Appears overweight. Not febrile. Eyes: Conjunctivae clear without exudates or hemorrhage  Neuro: Detailed Neurologic Exam  Speech:    Speech is normal; fluent and spontaneous with normal comprehension.  Cognition:    The patient is oriented to person, place, and time;     recent and remote memory intact;     language fluent;     normal attention, concentration,     fund of knowledge Cranial Nerves:    The pupils are equal, round, and reactive to light. Attempted, Cannot perform fundoscopic exam. Visual fields are full to finger confrontation. Extraocular movements are intact.  The face is symmetric with normal sensation. The palate elevates in the midline. Hearing intact. Voice is normal. Shoulder shrug is normal. The tongue has normal motion without fasciculations.   Coordination:    Normal finger to nose  Gait:    Normal native gait  Motor Observation:   no involuntary movements noted. Tone:    Appears normal  Posture:    Posture is normal. normal erect    Strength:    Strength is anti-gravity and symmetric in the upper and lower limbs.      Sensation: intact to LT     Reflex  Exam:  DTR's:    Attempted, Could not perform over Web Video   Toes: Attempted Could not perform over Web Video  Clonus:   Attempted, Could not perform over Web Video     Assessment/Plan:  43 yo with chronic migraines. However due to concerning symptoms she needs a thorough evaluation including labs and MRI of the brain.  - Referral to OBGYN, she needs a referral. She has had tubal ligation. Discuss with obgyn about birth control for migraines.  - She had a sleep study, she had an in-home study and now going to do an in-house study. Encourage her to follow up with [facility](she isn't sure) and discussed sequelae of untreated sleep apnea including headaches, stroke and others. - MRI brain due to concerning symptoms of morning headaches, positional headaches,vision changes  to look for space occupying mass, chiari or intracranial hypertension (pseudotumor). - Preventative: Aimovig - Acute- Maxalt and 8mg  zofran at onset, repeat Maxalt in 2 hours - Email me with updates - follow up in the office in 3 months - conservative measures for likely CTS, consider emg/ncs in the future  Orders Placed This Encounter  Procedures   MR BRAIN W WO CONTRAST   CBC   Comprehensive metabolic panel   Meds ordered this encounter  Medications   rizatriptan (MAXALT-MLT) 10 MG disintegrating tablet    Sig: Take 1 tablet (10 mg total) by mouth as needed for migraine. May repeat in 2 hours if needed    Dispense:  9 tablet    Refill:  11   Erenumab-aooe (AIMOVIG) 140 MG/ML SOAJ    Sig: Inject 140 mg into the skin every 30 (thirty) days.    Dispense:  1 pen    Refill:  11    Patient has copay card; she can have medication for $5 regardless of insurance approval or copay amount.   To prevent or relieve headaches, try the following: Cool Compress. Lie down and place a cool compress on your head.  Avoid headache triggers. If certain foods or odors seem to  have triggered your  migraines in the past, avoid them. A headache diary might help you identify triggers.  Include physical activity in your daily routine. Try a daily walk or other moderate aerobic exercise.  Manage stress. Find healthy ways to cope with the stressors, such as delegating tasks on your to-do list.  Practice relaxation techniques. Try deep breathing, yoga, massage and visualization.  Eat regularly. Eating regularly scheduled meals and maintaining a healthy diet might help prevent headaches. Also, drink plenty of fluids.  Follow a regular sleep schedule. Sleep deprivation might contribute to headaches Consider biofeedback. With this mind-body technique, you learn to control certain bodily functions -- such as muscle tension, heart rate and blood pressure -- to prevent headaches or reduce headache pain.    Proceed to emergency room if you experience new or worsening symptoms or symptoms do not resolve, if you have new neurologic symptoms or if headache is severe, or for any concerning symptom.   Provided education and documentation from American headache Society toolbox including articles on: chronic migraine medication overuse headache, chronic migraines, prevention of migraines, behavioral and other nonpharmacologic treatments for headache.   Follow Up Instructions:    I discussed the assessment and treatment plan with the patient. The patient was provided an opportunity to ask questions and all were answered. The patient agreed with the plan and demonstrated an understanding of the instructions.   The patient was advised to call back or seek an in-person evaluation if the symptoms worsen or if the condition fails to improve as anticipated.   Orders Placed This Encounter  Procedures   MR BRAIN W WO CONTRAST   CBC   Comprehensive metabolic panel   Meds ordered this encounter  Medications   rizatriptan (MAXALT-MLT) 10 MG disintegrating tablet    Sig: Take 1 tablet (10 mg total) by mouth  as needed for migraine. May repeat in 2 hours if needed    Dispense:  9 tablet    Refill:  11   Erenumab-aooe (AIMOVIG) 140 MG/ML SOAJ    Sig: Inject 140 mg into the skin every 30 (thirty) days.    Dispense:  1 pen    Refill:  11    Patient has copay card; she can have medication for $5 regardless of insurance approval or copay amount.    Cc: Cox, Elnita Maxwell, MD,  Cox, Elnita Maxwell, MD  Sarina Ill, MD  [facility] [address] [place], [place] [address]  Phone [phone] Fax [phone]

## 2018-10-08 NOTE — Progress Notes (Signed)
[facility] Counselor/Therapist Progress Note  [name], MRN: [id number],    Date: 10/08/2018  Time Spent:  58 minutes    12 noon to 12:58a.m.   Treatment Type: Individual Therapy   Virtual Visit Note I connected with patient by a video enabled telemedicine/telehealth application or telephone, with their informed consent, and verified patient privacy and that I am speaking with the correct person using two identifiers. I am at [facility] and patient is at her home.   I discussed the limitations, risks, security and privacy concerns of performing psychotherapy and management service by telephone and the availability of in person appointments. I also discussed with the patient that there may be a patient responsible charge related to this service. The patient expressed understanding and agreed to proceed.  I discussed the treatment planning with the patient. The patient was provided an opportunity to ask questions and all were answered. The patient agreed with the plan and demonstrated an understanding of the instructions.   The patient was advised to call  our office if  symptoms worsen or feel they are in a crisis state and need immediate contact.   Reported Symptoms:  Depression, anxious, tiredness and oversleeping at times  Mental Status Exam:  Appearance:   N/A  (telehealth)  Behavior:  sharing  Motor:  Normal  Speech/Language:   Normal Rate  Affect:  N/A (telehealth)  Mood:  anxious and depressed   Thought process:  normal  Thought content:    WNL  Sensory/Perceptual disturbances:    WNL  Orientation:  oriented to person, place, time/date, situation, day of week, month of year and year  Attention:  Fair  Concentration:  Fair  Memory:  [place] of knowledge:   Good  Insight:    Fair  Judgment:   Fair  Impulse Control:  Good   Risk Assessment: Danger to Self:  No Self-injurious Behavior: No Danger to Others: No Duty to Warn:no Physical  Aggression / Violence:No  Access to Firearms a concern: No  Gang Involvement:No   Subjective:  Patient reports continued symptoms noted above, am trying "a little more" to feel better.  Is trying more to get outside and have some physical activity like walking, frisbee.  Trying to be more positive.  Been doing a facebook page "about introverts", I made up a "fake account" to be a part of that group and found that a lot of people experience similar problems. Has found that to be of some help. Husband has had moments of being a little more helpful recently. Patient seemed a little more positive in some ways but also somewhat irritable and stressed, and part of it could be the   legal situation with her 71 yr old daughter mentioned last session has calmed down a bit but not resolved. Financial stressors continues. 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 At one point when we were discussing "positives" and trying to include some "positive thoughts in every day" (and I meant genuinely positive, not fake or unrealistic, but even simple positive things).  Patient stated that she doesn't "believe in positives because they are so fake".  Shared that "I may look for good things to happen, but I expect the worst.".  I didn't really challenge this with patient as I didn't want her to feel like she "said something wrong" because for her, I think she was being honest and  wanted her to feel heard and understood.  From the history she has shared with me, it is understandable how that may be her outlook.    Reviewed strategies to help manage stress, anxiety, and depression.  Goal review with some progress noted with patient and plan to also work further on self esteem, which she rates still as a "4" on 1-10 scale, and has previously stated that "about a 6" is the highest she's ever been.  Encouraged overall self-care, less self-negating, and consider again the changes she  would want to make "if she had the strength and was not afraid to try."  Interventions: Cognitive Behavioral Therapy and Solution-Oriented/Positive Psychology  Diagnosis:   ICD-10-CM   1. Major depressive disorder, recurrent episode, moderate (HCC) F33.1     Plan:   To continue follow through on self-forgiveness strategies and reports that some of her negative self-talk has decreased.   To work with CBT strategies taught in a prior session, and especially in trying to gain a more hopeful outlook, and to consider possible changes that may benefit her. Goal review with patient.  Working to have more positive self-talk as she decreases the negative, limiting, messages to herself.  Next session to be within 2 wks.  Shanon Ace, LCSW

## 2018-10-09 ENCOUNTER — Encounter: Payer: Self-pay | Admitting: *Deleted

## 2018-10-09 ENCOUNTER — Telehealth: Payer: Self-pay | Admitting: *Deleted

## 2018-10-09 NOTE — Telephone Encounter (Signed)
Completed Aimovig 140 mg PA on Cover My Meds. KEY: [id number]. Received immediate approval. Effective from 10/09/2018 through 01/06/2019.   Sent pt a Therapist, music.

## 2018-10-16 ENCOUNTER — Telehealth: Payer: Self-pay | Admitting: Neurology

## 2018-10-16 NOTE — Telephone Encounter (Signed)
spoke to the pt due to her hours she couldnt do tues/wed i informed her i would send the order to GI. They will reach out to her to schedule.  BCBS Auth: [id number] (exp. 10/15/18 to 04/12/19)

## 2018-10-17 ENCOUNTER — Other Ambulatory Visit: Payer: Self-pay | Admitting: Neurology

## 2018-10-17 DIAGNOSIS — N921 Excessive and frequent menstruation with irregular cycle: Secondary | ICD-10-CM

## 2018-10-17 MED ORDER — PROPRANOLOL HCL ER 80 MG PO CP24: ORAL_CAPSULE | ORAL | 6 refills | Status: AC

## 2018-10-18 ENCOUNTER — Telehealth: Payer: Self-pay | Admitting: Neurology

## 2018-10-18 NOTE — Telephone Encounter (Signed)
Called and talked to patient and relayed that her PCP would have to refer her to obgyn she understood and would call her PCP.

## 2018-10-23 ENCOUNTER — Ambulatory Visit (INDEPENDENT_AMBULATORY_CARE_PROVIDER_SITE_OTHER): Payer: BLUE CROSS/BLUE SHIELD | Admitting: Psychiatry

## 2018-10-23 ENCOUNTER — Other Ambulatory Visit: Payer: Self-pay

## 2018-10-23 DIAGNOSIS — F331 Major depressive disorder, recurrent, moderate: Secondary | ICD-10-CM | POA: Diagnosis not present

## 2018-10-23 NOTE — Progress Notes (Signed)
[facility] Counselor/Therapist Progress Note  [name], MRN: [id number],    Date: 10/23/2018  Time Spent:  60 minutes    8:00am to 9:00am   Treatment Type: Individual Therapy   Virtual Visit Note I connected with patient by a video enabled telemedicine/telehealth application or telephone, with their informed consent, and verified patient privacy and that I am speaking with the correct person using two identifiers. I am at [facility] and patient is at her home.   I discussed the limitations, risks, security and privacy concerns of performing psychotherapy and management service by telephone and the availability of in person appointments. I also discussed with the patient that there may be a patient responsible charge related to this service. The patient expressed understanding and agreed to proceed.  I discussed the treatment planning with the patient. The patient was provided an opportunity to ask questions and all were answered. The patient agreed with the plan and demonstrated an understanding of the instructions.   The patient was advised to call  our office if  symptoms worsen or feel they are in a crisis state and need immediate contact.   Reported Symptoms:  Depression, anxious, tiredness and oversleeping at times  Mental Status Exam:  Appearance:   N/A  (telehealth)  Behavior:  sharing  Motor:  Normal  Speech/Language:   Normal Rate  Affect:  N/A (telehealth)  Mood:  anxious and depressed, more anxiousness per patient  Thought process:  normal  Thought content:    WNL  Sensory/Perceptual disturbances:    WNL  Orientation:  oriented to person, place, time/date, situation, day of week, month of year and year  Attention:  Fair  Concentration:  Fair  Memory:  [place] of knowledge:   Good  Insight:    Fair  Judgment:   Fair  Impulse Control:  Good   Risk Assessment: Danger to Self:  No Self-injurious Behavior: No Danger to Others: No Duty  to Warn:no Physical Aggression / Violence:No  Access to Firearms a concern: No  Gang Involvement:No   Subjective:  Patient reports continued symptoms noted above, although feels worse, and the anxiety is the strongest. Husband was fired this week due to issues with supervisor.  He has another inquiry on job today.  "I feel bad physically, and wonder if it's fibromyalgia and I'm doing the sleep study this Friday in [place] [facility]. "Wonders if she is in perimenopause as cycle is irregular and I talk with my primary care doctor about this." Occasional headaches, joint pain.  Has started taking magnesium and chest pains are much better and has been in touch with pcp on that also. Feels that chronic sleep issues could be influencing all physical complaints, so hoping the upcoming sleep study will determine issues and what can be done to help symptoms.  On 1 to 10 for anxiety, she rates herself a "6" today.  On 1-10 scale for depression she rates herself a "4" today.    Had encouraged CBT with patient before,  but she says the best approach for her in trying to manage symptoms "the best for me is to assume things are going to go badly and then when the do, at least I expected it; and if things don't go badly, I am happy."  Shared that she's not looking for options to try but rather what helps her is being listened to and allowing her to vent.  She does not have other  reliable resources for this.  Encouraged overall self-care, including getting outside some doing some of the things she has enjoyed in past including walking, frisbee, and just being outside some.   Reviewed strategies to help manage stress, anxiety, and depression.  Goal review with some progress noted with patient and plan to also work further on self esteem, which sh Encouraged overall self-care, less self-negating, and consider again the changes she would want to make "if she had the strength and was not afraid to  try."  Primary need per patient is to continue to be heard as she reports long history in therapy and feels like people were always "wanting me to change."   Interventions: Cognitive Behavioral Therapy and Solution-Oriented/Positive Psychology  Diagnosis:   ICD-10-CM   1. Major depressive disorder, recurrent episode, moderate (HCC) F33.1     Plan:    To continue being very open in sessions to vent and to process her concerns, and especially in trying to gain a more hopeful outlook, and to consider possible changes that may benefit her. Goal review with patient.  Working to have more positive self-talk as she decreases the negative, limiting, messages to herself.  Next session to be within 2 wks.  Shanon Ace, LCSW

## 2018-11-08 ENCOUNTER — Ambulatory Visit (INDEPENDENT_AMBULATORY_CARE_PROVIDER_SITE_OTHER): Payer: BLUE CROSS/BLUE SHIELD | Admitting: Psychiatry

## 2018-11-08 ENCOUNTER — Other Ambulatory Visit: Payer: Self-pay

## 2018-11-08 DIAGNOSIS — F331 Major depressive disorder, recurrent, moderate: Secondary | ICD-10-CM

## 2018-11-08 NOTE — Progress Notes (Signed)
[facility] Counselor/Therapist Progress Note  [name], MRN: [id number],    Date: 11/08/2018  Time Spent:  60 minutes    8:00am to 9:00am   Treatment Type: Individual Therapy   Virtual Visit Note I connected with patient by a video enabled telemedicine/telehealth application or telephone, with their informed consent, and verified patient privacy and that I am speaking with the correct person using two identifiers. I am at [facility] and patient is at her home.   I discussed the limitations, risks, security and privacy concerns of performing psychotherapy and management service by telephone and the availability of in person appointments. I also discussed with the patient that there may be a patient responsible charge related to this service. The patient expressed understanding and agreed to proceed.  I discussed the treatment planning with the patient. The patient was provided an opportunity to ask questions and all were answered. The patient agreed with the plan and demonstrated an understanding of the instructions.   The patient was advised to call  our office if  symptoms worsen or feel they are in a crisis state and need immediate contact.   Reported Symptoms:  Depression, anxious, tiredness, sometimes overwhelmed  Mental Status Exam:  Appearance:   N/A  (telehealth)  Behavior:  sharing  Motor:  Normal  (per patient--telehealth)  Speech/Language:   Normal Rate  Affect:  N/A (telehealth)  Mood:  anxious and depressed, more anxiousness per patient  Thought process:  normal  Thought content:    WNL  Sensory/Perceptual disturbances:    WNL  Orientation:  oriented to person, place, time/date, situation, day of week, month of year and year  Attention:  Fair  Concentration:  Fair  Memory:  [place] of knowledge:   Good  Insight:    Fair  Judgment:   Fair  Impulse Control:  Good   Risk Assessment: Danger to Self:  No Self-injurious Behavior:  No Danger to Others: No Duty to Warn:no Physical Aggression / Violence:No  Access to Firearms a concern: No  Gang Involvement:No   Subjective:  Patient today reporting symptoms above and difficult for her to clarify more details initially.  She became more open as we spoke.  Has said before what helps her the most is "feeling heard".  Is anxious to get her sleep study results and she plans to call their office tomorrow to follow up. She suspects that is part of her issue in not getting good rest and feeling  "tired all the time." Husband was fired in recent weeks due to issues with supervisor.  Still wonders if she is in perimenopause as cycle is irregular and I talk with my primary care doctor about this and ask for a referral to an ob-gyn.  Occaslonal headaches, joint pain still occur.  On 1 to 10 scale  for anxiety, she rates herself a "3-4" today.  On 1-10 scale for depression she rates herself a "3" today.  Compared to last session, both scales show improvement and patient states she feels some bit of hopefulness things might get better.  Feels that her meds, and talking things out and being heard are helping her the most to gain some hope.  Also feels mediations/prayers and focusing on favorite scriptures is also helpful. Shared again that she's not looking for options nor techniques to try but rather what helps her is being listened to and allowing her to vent.  She does not have other reliable resources for  this.  Encouraged overall self-care, including getting outside some doing some of the things she has enjoyed in past including walking, frisbee, and just being outside some.   Reviewed strategies to help manage stress, anxiety, and depression.  Goal review with some progress noted with patient as she works further on self esteem, and encouraged overall self-care, and less self-negating.    Primary need per patient is to continue to be heard as she reports long history in therapy and feels  like people were always "wanting me to change."   Interventions: Cognitive Behavioral Therapy and Solution-Oriented/Positive Psychology  Diagnosis:   ICD-10-CM   1. Major depressive disorder, recurrent episode, moderate (HCC) F33.1     Plan:    To continue being very open in sessions to vent and to process her concerns, and especially in trying to gain a more hopeful outlook, and to consider possible changes that may benefit her. Goal review with patient.  Working to have more positive self-talk as she decreases the negative, limiting, messages to herself.  Next session to be within 2 wks.  Shanon Ace, LCSW

## 2018-11-22 ENCOUNTER — Other Ambulatory Visit: Payer: Self-pay

## 2018-11-22 ENCOUNTER — Ambulatory Visit (INDEPENDENT_AMBULATORY_CARE_PROVIDER_SITE_OTHER): Payer: BC Managed Care – PPO | Admitting: Psychiatry

## 2018-11-22 DIAGNOSIS — F331 Major depressive disorder, recurrent, moderate: Secondary | ICD-10-CM

## 2018-11-22 NOTE — Progress Notes (Deleted)
[facility] Counselor/Therapist Progress Note  [name], MRN: [id number],    Date: 11/22/2018  Time Spent:  60 minutes    8:00am to 9:00am   Treatment Type: Individual Therapy   Virtual Visit Note I connected with patient by a video enabled telemedicine/telehealth application or telephone, with their informed consent, and verified patient privacy and that I am speaking with the correct person using two identifiers. I am at [facility] and patient is at her home.   I discussed the limitations, risks, security and privacy concerns of performing psychotherapy and management service by telephone and the availability of in person appointments. I also discussed with the patient that there may be a patient responsible charge related to this service. The patient expressed understanding and agreed to proceed.  I discussed the treatment planning with the patient. The patient was provided an opportunity to ask questions and all were answered. The patient agreed with the plan and demonstrated an understanding of the instructions.   The patient was advised to call  our office if  symptoms worsen or feel they are in a crisis state and need immediate contact.   Reported Symptoms:  Depression, anxious, tiredness, sometimes overwhelmed  Mental Status Exam:  Appearance:   N/A  (telehealth)  Behavior:  sharing  Motor:  Normal  (per patient--telehealth)  Speech/Language:   Normal Rate  Affect:  N/A (telehealth)  Mood:  anxious and depressed, more anxiousness per patient  Thought process:  normal  Thought content:    WNL  Sensory/Perceptual disturbances:    WNL  Orientation:  oriented to person, place, time/date, situation, day of week, month of year and year  Attention:  Fair  Concentration:  Fair  Memory:  [place] of knowledge:   Good  Insight:    Fair  Judgment:   Fair  Impulse Control:  Good   Risk Assessment: Danger to Self:  No Self-injurious Behavior:  No Danger to Others: No Duty to Warn:no Physical Aggression / Violence:No  Access to Firearms a concern: No  Gang Involvement:No   Subjective:  Patient today reporting symptoms above and difficult for her to clarify more details initially.  She became more open as we spoke.  Has said before what helps her the most is "feeling heard".  Is anxious to get her sleep study results and she plans to call their office tomorrow to follow up. She suspects that is part of her issue in not getting good rest and feeling  "tired all the time." Husband was fired in recent weeks due to issues with supervisor.  Still wonders if she is in perimenopause as cycle is irregular and I talk with my primary care doctor about this and ask for a referral to an ob-gyn.  Occaslonal headaches, joint pain still occur.  On 1 to 10 scale  for anxiety, she rates herself a "3-4" today.  On 1-10 scale for depression she rates herself a "3" today.  Compared to last session, both scales show improvement and patient states she feels some bit of hopefulness things might get better.  Feels that her meds, and talking things out and being heard are helping her the most to gain some hope.  Also feels mediations/prayers and focusing on favorite scriptures is also helpful. Shared again that she's not looking for options nor techniques to try but rather what helps her is being listened to and allowing her to vent.  She does not have other reliable resources for  this.  Encouraged overall self-care, including getting outside some doing some of the things she has enjoyed in past including walking, frisbee, and just being outside some.   Reviewed strategies to help manage stress, anxiety, and depression.  Goal review with some progress noted with patient as she works further on self esteem, and encouraged overall self-care, and less self-negating.    Primary need per patient is to continue to be heard as she reports long history in therapy and feels  like people were always "wanting me to change."   Interventions: Cognitive Behavioral Therapy and Solution-Oriented/Positive Psychology  Diagnosis: No diagnosis found.  Plan:    To continue being very open in sessions to vent and to process her concerns, and especially in trying to gain a more hopeful outlook, and to consider possible changes that may benefit her. Goal review with patient.  Working to have more positive self-talk as she decreases the negative, limiting, messages to herself.  Next session to be within 2 wks.  Shanon Ace, LCSW

## 2018-11-22 NOTE — Progress Notes (Signed)
[facility] Counselor/Therapist Progress Note  [name], MRN: [id number],    Date: 11/22/2018  Time Spent:  60 minutes    8:00am to 9:00am   Treatment Type: Individual Therapy   Virtual Visit Note I connected with patient by a video enabled telemedicine/telehealth application or telephone, with their informed consent, and verified patient privacy and that I am speaking with the correct person using two identifiers. I am at [facility] and patient is at her home.   I discussed the limitations, risks, security and privacy concerns of performing psychotherapy and management service by telephone and the availability of in person appointments. I also discussed with the patient that there may be a patient responsible charge related to this service. The patient expressed understanding and agreed to proceed.  I discussed the treatment planning with the patient. The patient was provided an opportunity to ask questions and all were answered. The patient agreed with the plan and demonstrated an understanding of the instructions.   The patient was advised to call  our office if  symptoms worsen or feel they are in a crisis state and need immediate contact.   Reported Symptoms:  Depression, anxious, tiredness, sometimes overwhelmed  Mental Status Exam:  Appearance:   N/A  (telehealth)  Behavior:  sharing  Motor:  Normal  (per patient--telehealth)  Speech/Language:   Normal Rate  Affect:  N/A (telehealth)  Mood:  anxious and depressed, more anxiousness and tiredness per patient  Thought process:  normal  Thought content:    WNL  Sensory/Perceptual disturbances:    WNL  Orientation:  oriented to person, place, time/date, situation, day of week, month of year and year  Attention:  Fair  Concentration:  Fair  Memory:  [place] of knowledge:   Good  Insight:    Fair  Judgment:   Fair  Impulse Control:  Good   Risk Assessment: Danger to Self:  No Self-injurious  Behavior: No Danger to Others: No Duty to Warn:no Physical Aggression / Violence:No  Access to Firearms a concern: No  Gang Involvement:No   Subjective: Patient reports symptoms noted above and says that her primary symptom is "Tiredness" and some anxiety, but "mostly tiredness".  Still has not gotten results of sleep study back and she suspects there may be some issues there and it could be contributing to her tiredness.  States she has tried calling doctor's office but gets no response.  Encouraged her to check back in with them or maybe stop by office to inquire best way to reach someone.  Says she might but is thinking about going by next week.  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   Supported her getting sleep and following up with her doctor as soon as she is able.  Did state that her depression and anxiety were not as bad " but that may be because I stay tired."    Reviewed strategies to help manage stress, anxiety, and depression.  Goal review with some progress noted with patient as she works further on self esteem, and encouraged overall self-care, and less self-negating. Primary need per patient is to continue to be heard as she reports long history in therapy and feels like people were always "wanting me to change when I didn't want to."   Interventions: Cognitive Behavioral Therapy and Solution-Oriented/Positive Psychology  Diagnosis:  ICD-10-CM   1. Major depressive disorder, recurrent episode, moderate (HCC)  F33.1     Plan:    To continue being very open in sessions to vent and to process her concerns, and especially in trying to gain a more hopeful outlook, and to consider possible changes that may benefit her. Goal review with patient.  Working to have more positive self-talk as she decreases the negative, limiting, messages to herself.  Next session to be  within 2 wks.  Shanon Ace, LCSW

## 2018-12-25 ENCOUNTER — Telehealth: Payer: Self-pay | Admitting: *Deleted

## 2018-12-25 NOTE — Telephone Encounter (Signed)
Completed renewal PA for Aimovig on CMM. KEY: [id number]. Awaiting BCBS determination.   Per CMM: If [employer] has not responded in 3 business days or if you have any questions about your submission, contact [place] at [phone].

## 2018-12-27 ENCOUNTER — Encounter: Payer: Self-pay | Admitting: *Deleted

## 2018-12-27 NOTE — Telephone Encounter (Addendum)
Received fax from [place] [employer] [place]. Aimovig approved from 12/25/2018-12/24/2019. Faxed approval letter to [facility]. Received a receipt of confirmation. Sent pt [employer].

## 2019-01-09 ENCOUNTER — Ambulatory Visit: Payer: BC Managed Care – PPO | Admitting: Physician Assistant

## 2019-01-09 ENCOUNTER — Other Ambulatory Visit: Payer: Self-pay

## 2019-01-09 MED ORDER — FLUOXETINE HCL 40 MG PO CAPS: 40.0000 mg | ORAL_CAPSULE | Freq: Every day | ORAL | 1 refills | Status: DC

## 2019-01-18 ENCOUNTER — Ambulatory Visit (INDEPENDENT_AMBULATORY_CARE_PROVIDER_SITE_OTHER): Payer: BC Managed Care – PPO | Admitting: Psychiatry

## 2019-01-18 ENCOUNTER — Other Ambulatory Visit: Payer: Self-pay

## 2019-01-18 DIAGNOSIS — F411 Generalized anxiety disorder: Secondary | ICD-10-CM

## 2019-01-18 NOTE — Progress Notes (Signed)
[facility] Counselor/Therapist Progress Note  [name], MRN: [id number],    Date: 01/18/2019  Time Spent:  60 minutes    9:00am to 10:00am   Treatment Type: Individual Therapy   Virtual Visit Note I connected with patient by a video enabled telemedicine/telehealth application or telephone, with their informed consent, and verified patient privacy and that I am speaking with the correct person using two identifiers. I am at [facility] and patient is at her home.   I discussed the limitations, risks, security and privacy concerns of performing psychotherapy and management service by telephone and the availability of in person appointments. I also discussed with the patient that there may be a patient responsible charge related to this service. The patient expressed understanding and agreed to proceed.  I discussed the treatment planning with the patient. The patient was provided an opportunity to ask questions and all were answered. The patient agreed with the plan and demonstrated an understanding of the instructions.   The patient was advised to call  our office if  symptoms worsen or feel they are in a crisis state and need immediate contact.   Reported Symptoms:  Depression (much less), anxious (increased), irritability,tiredness, sometimes overwhelmed  Mental Status Exam:  Appearance:   N/A  (telehealth)  Behavior:  sharing  Motor:  Normal  (per patient--telehealth)  Speech/Language:   Normal Rate  Affect:  N/A (telehealth)  Mood:  anxious and depressed, more anxiousness and tiredness per patient  Thought process:  normal  Thought content:    WNL  Sensory/Perceptual disturbances:    WNL  Orientation:  oriented to person, place, time/date, situation, day of week, month of year and year  Attention:  Fair  Concentration:  Fair  Memory:  WNL  Fund of knowledge:   Good  Insight:    Fair  Judgment:   Fair  Impulse Control:  Good   Risk  Assessment: Danger to Self:  No Self-injurious Behavior: No Danger to Others: No Duty to Warn:no Physical Aggression / Violence:No  Access to Firearms a concern: No  Gang Involvement:No   Subjective: Patient reports symptoms noted above and says that her primary symptom is Anxiety and irritability, and tiredness.  Still has not gotten results of sleep study back and she does have sleep apnea and now using C-pap. Patient says even though her sleep is some better, it has not affected her tiredness.  Work stressors continue and stress is greater at work. Lot of anxiety of "getting behind at work" and hard to set limits with other people at work who want to talk a lot. Talked about how she can set better limits with people at work, and know that is needed in order to get her work done.  Patient very sensitive about needly mostly "to be heard and doesn't want people to offer suggestions", so I am sensitive about helping her realize she is heard in sessions and based on problems she reports, try to tactfully share potential strategies. Often speaks of her resentment of others trying tell her what she should be doing, referring to friends, co-workers, husband.   "I struggle most with general life stressors": Frustrated with society in general I'm an introvert and people always assume you need to be with others all the time Frustrated with my thoughts, they are jumbled up, I don't know where they're going,not concrete Don't always know how I feel about things Sometimes can't get the words out to explain my thoughts  Frustrated at times, but can't wrap my head around why I'm frustrated Concern about my tiredness  Doesn't talk openly with many people When I talk with you, I don't have to be embarrassed nor wear a mask nor pretend  Supported her getting sleep and following up with her doctor as soon as she is able. Mentioned a couple ways to help manage stress, anxiety.  Goal review with some progress  noted with patient as she works further on self esteem, thought patterns (negative vs positive) and encouraged overall self-care, and less self-negating. She doesn't really look at thoughts "as being positive or negative", but rather are they "realistic".  ** Primary need per patient is to continue to be heard as she reports long history in therapy and feels like people were always "wanting me to change when I didn't want to change some things that were suggested."   Interventions: Cognitive Behavioral Therapy and Solution-Oriented/Positive Psychology  Diagnosis:   ICD-10-CM   1. Generalized anxiety disorder  F41.1     Plan:    To continue being very open in sessions to vent and to process her concerns, and especially in trying to gain a more hopeful outlook, and to consider possible changes that may benefit her. Goal review with patient.  Working to have more positive self-talk as she decreases the negative, limiting, messages to herself.  Next session to be within 2 wks.  Shanon Ace, LCSW

## 2019-01-19 ENCOUNTER — Emergency Department (HOSPITAL_COMMUNITY)
Admission: EM | Admit: 2019-01-19 | Discharge: 2019-01-20 | Disposition: A | Discharge status: Home / Self Care | Payer: BC Managed Care – PPO | Attending: Emergency Medicine | Admitting: Emergency Medicine

## 2019-01-19 ENCOUNTER — Encounter (HOSPITAL_COMMUNITY): Payer: Self-pay | Admitting: Emergency Medicine

## 2019-01-19 ENCOUNTER — Other Ambulatory Visit: Payer: Self-pay

## 2019-01-19 DIAGNOSIS — D62 Acute posthemorrhagic anemia: Secondary | ICD-10-CM | POA: Insufficient documentation

## 2019-01-19 DIAGNOSIS — N939 Abnormal uterine and vaginal bleeding, unspecified: Secondary | ICD-10-CM

## 2019-01-19 DIAGNOSIS — Z87891 Personal history of nicotine dependence: Secondary | ICD-10-CM | POA: Diagnosis not present

## 2019-01-19 DIAGNOSIS — Z79899 Other long term (current) drug therapy: Secondary | ICD-10-CM | POA: Insufficient documentation

## 2019-01-19 MED ORDER — SODIUM CHLORIDE 0.9 % IV BOLUS
1000.0000 mL | Freq: Once | INTRAVENOUS | Status: AC
  Administered 2019-01-19: 1000 mL via INTRAVENOUS

## 2019-01-19 NOTE — ED Triage Notes (Signed)
Pt reports having vaginal bleeding for the last 6 months intermittently. Pt reports being seen by OB and PCP for same. Pt states that over the last day she has been having increasing bleeding and weakness.

## 2019-01-20 LAB — BASIC METABOLIC PANEL
Anion gap: 7 (ref 5–15)
BUN: 11 mg/dL (ref 6–20)
CO2: 23 mmol/L (ref 22–32)
Calcium: 9 mg/dL (ref 8.9–10.3)
Chloride: 109 mmol/L (ref 98–111)
Creatinine, Ser: 0.71 mg/dL (ref 0.44–1.00)
GFR calc Af Amer: >60 mL/min (ref >60)
GFR calc non Af Amer: >60 mL/min (ref >60)
Glucose, Bld: 108 mg/dL — ABNORMAL HIGH (ref 70–99)
Potassium: 3.6 mmol/L (ref 3.5–5.1)
Sodium: 139 mmol/L (ref 135–145)

## 2019-01-20 LAB — CBC
HCT: 27.4 % — ABNORMAL LOW (ref 36.0–46.0)
HCT: 28.3 % — ABNORMAL LOW (ref 36.0–46.0)
Hemoglobin: 7.8 g/dL — ABNORMAL LOW (ref 12.0–15.0)
Hemoglobin: 8.3 g/dL — ABNORMAL LOW (ref 12.0–15.0)
MCH: 23.9 pg — ABNORMAL LOW (ref 26.0–34.0)
MCH: 24.9 pg — ABNORMAL LOW (ref 26.0–34.0)
MCHC: 28.5 g/dL — ABNORMAL LOW (ref 30.0–36.0)
MCHC: 29.3 g/dL — ABNORMAL LOW (ref 30.0–36.0)
MCV: 84 fL (ref 80.0–100.0)
MCV: 85 fL (ref 80.0–100.0)
Platelets: 309 10*3/uL (ref 150–400)
Platelets: 281 10*3/uL (ref 150–400)
RBC: 3.26 MIL/uL — ABNORMAL LOW (ref 3.87–5.11)
RBC: 3.33 MIL/uL — ABNORMAL LOW (ref 3.87–5.11)
RDW: 17.7 % — ABNORMAL HIGH (ref 11.5–15.5)
RDW: 17.1 % — ABNORMAL HIGH (ref 11.5–15.5)
WBC: 6 10*3/uL (ref 4.0–10.5)
WBC: 5.2 10*3/uL (ref 4.0–10.5)
nRBC: 0 % (ref 0.0–0.2)
nRBC: 0 % (ref 0.0–0.2)

## 2019-01-20 LAB — ABO/RH: ABO/RH(D): O POS

## 2019-01-20 LAB — PREPARE RBC (CROSSMATCH)

## 2019-01-20 MED ORDER — MEDROXYPROGESTERONE ACETATE 10 MG PO TABS
10.0000 mg | ORAL_TABLET | Freq: Once | ORAL | Status: AC
  Administered 2019-01-20: 10 mg via ORAL
  Filled 2019-01-20: qty 1

## 2019-01-20 MED ORDER — KETOROLAC TROMETHAMINE 15 MG/ML IJ SOLN
15.0000 mg | Freq: Once | INTRAMUSCULAR | Status: AC
  Administered 2019-01-20: 15 mg via INTRAVENOUS
  Filled 2019-01-20: qty 1

## 2019-01-20 MED ORDER — MEDROXYPROGESTERONE ACETATE 10 MG PO TABS: 10.0000 mg | ORAL_TABLET | Freq: Every day | ORAL | 0 refills | Status: DC

## 2019-01-20 MED ORDER — SODIUM CHLORIDE 0.9 % IV SOLN
10.0000 mL/h | Freq: Once | INTRAVENOUS | Status: AC
  Administered 2019-01-20: 10 mL/h via INTRAVENOUS

## 2019-01-20 NOTE — ED Provider Notes (Addendum)
[facility] DEPT Provider Note  CSN: [id number] Arrival date & time: 01/19/19 2158  Chief Complaint(s) Vaginal Bleeding  HPI Amanda Marks is a 43 y.o. 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 She goes through 1 pad an hour. Reports fatigue and DOE. She was seen at [facility] ED this am and reports a Hb of 9. She was given dose of Provera at that time. Returns for continued heavy bleeding and worsening fatigue. No recent fevers.    HPI  Past Medical History Past Medical History:  Diagnosis Date  . Depression   . Kidney stones   . Migraines    Patient Active Problem List   Diagnosis Date Noted  . Chronic migraine without aura without status migrainosus, not intractable 10/08/2018  . Major depression, recurrent, chronic ([place]) 04/03/2018  . Major depressive disorder, single episode, severe ([place]) 04/02/2018   Home Medication(s) Prior to Admission medications   Medication Sig Start Date End Date Taking? Authorizing Provider  clonazePAM (KLONOPIN) 0.5 MG tablet Take 0.5-1 tablets (0.25-0.5 mg total) by mouth 2 (two) times daily as needed for anxiety. 08/20/18  Yes Hurst, Dorothea Glassman, PA-C  Erenumab-aooe (AIMOVIG) 140 MG/ML SOAJ Inject 140 mg into the skin every 30 (thirty) days. 10/08/18  Yes Melvenia Beam, MD  FLUoxetine (PROZAC) 40 MG capsule Take 1 capsule (40 mg total) by mouth daily. 01/09/19  Yes Hurst, Teresa T, PA-C  propranolol ER (INDERAL LA) 80 MG 24 hr capsule TAKE ONE CAPSULE BY MOUTH AT BEDTIME Patient taking differently: Take 80 mg by mouth at bedtime.  10/17/18  Yes Melvenia Beam, MD  rizatriptan (MAXALT-MLT) 10 MG disintegrating tablet Take 1 tablet (10 mg total) by mouth as needed for migraine. May repeat in 2 hours if needed 10/08/18  Yes Melvenia Beam, MD  amLODipine (NORVASC) 5 MG tablet Take 1 tablet (5 mg total) by  mouth daily. For high blood pressure Patient not taking: Reported on 08/20/2018 04/04/18   Money, Lowry Ram, FNP  medroxyPROGESTERone (PROVERA) 10 MG tablet Take 1 tablet (10 mg total) by mouth daily for 5 days. 01/20/19 01/25/19  Amanda Blank, MD  mirtazapine (REMERON) 7.5 MG tablet Take 1 tablet (7.5 mg total) by mouth at bedtime as needed. For mood control and sleep Patient not taking: Reported on 01/20/2019 05/21/18   Donnal Moat T, PA-C  modafinil (PROVIGIL) 200 MG tablet Take 0.5 tablets (100 mg total) by mouth daily as needed. Patient not taking: Reported on 01/20/2019 10/01/18   Alen Blew                                                                                                                                    Past Surgical History Past Surgical History:  Procedure Laterality Date  . CHOLECYSTECTOMY    .  STENT PLACE LEFT URETER ([place] HX)    . TUBAL LIGATION     Family History Family History  Problem Relation Age of Onset  . Migraines Neg Hx     Social History Social History   Tobacco Use  . Smoking status: Former Smoker    Types: E-cigarettes  . Smokeless tobacco: Never Used  Substance Use Topics  . Alcohol use: No    Frequency: Never  . Drug use: Never   Allergies Oxycodone-acetaminophen and Nitrofurantoin  Review of Systems Review of Systems All other systems are reviewed and are negative for acute change except as noted in the HPI  Physical Exam Vital Signs  I have reviewed the triage vital signs BP (!) 149/80   Pulse 86   Temp 98.1 F (36.7 C) (Oral)   Resp (!) 22   Ht 5\' 2"  (1.575 m)   Wt 94.3 kg   SpO2 100%   BMI 38.04 kg/m   Physical Exam Vitals signs reviewed.  Constitutional:      General: She is not in acute distress.    Appearance: She is well-developed. She is not diaphoretic.  HENT:     Head: Normocephalic and atraumatic.     Right Ear: External ear normal.     Left Ear: External ear normal.     Nose: Nose normal.   Eyes:     General: No scleral icterus.    Conjunctiva/sclera: Conjunctivae normal.  Neck:     Musculoskeletal: Normal range of motion.     Trachea: Phonation normal.  Cardiovascular:     Rate and Rhythm: Normal rate and regular rhythm.  Pulmonary:     Effort: Pulmonary effort is normal. No respiratory distress.     Breath sounds: No stridor.  Abdominal:     General: There is no distension.     Tenderness: There is no abdominal tenderness.  Musculoskeletal: Normal range of motion.  Neurological:     Mental Status: She is alert and oriented to person, place, and time.  Psychiatric:        Behavior: Behavior normal.     ED Results and Treatments Labs (all labs ordered are listed, but only abnormal results are displayed) Labs Reviewed  CBC - Abnormal; Notable for the following components:      Result Value   RBC 3.26 (*)    Hemoglobin 7.8 (*)    HCT 27.4 (*)    MCH 23.9 (*)    MCHC 28.5 (*)    RDW 17.7 (*)    All other components within normal limits  BASIC METABOLIC PANEL - Abnormal; Notable for the following components:   Glucose, Bld 108 (*)    All other components within normal limits  CBC - Abnormal; Notable for the following components:   RBC 3.33 (*)    Hemoglobin 8.3 (*)    HCT 28.3 (*)    MCH 24.9 (*)    MCHC 29.3 (*)    RDW 17.1 (*)    All other components within normal limits  PREPARE RBC (CROSSMATCH)  TYPE AND SCREEN  ABO/RH                OSH CBC  EKG  EKG Interpretation  Date/Time:  Saturday January 19 2019 23:50:53 EDT Ventricular Rate:  81 PR Interval:    QRS Duration: 85 QT Interval:  380 QTC Calculation: 442 R Axis:   41 Text Interpretation:  Sinus rhythm Low voltage, precordial leads Borderline T abnormalities, anterior leads No significant change since last tracing Confirmed by Addison Lank [phone]) on 01/20/2019 12:19:43 AM Also confirmed by  Addison Lank [phone]), editor Philomena Doheny [phone])  on 01/20/2019 7:22:12 AM      Radiology No results found.  Pertinent labs & imaging results that were available during my care of the patient were reviewed by me and considered in my medical decision making (see chart for details).  Medications Ordered in ED Medications  medroxyPROGESTERone (PROVERA) tablet 10 mg (has no administration in time range)  sodium chloride 0.9 % bolus 1,000 mL (0 mLs Intravenous Stopped 01/20/19 0045)  0.9 %  sodium chloride infusion (0 mL/hr Intravenous Stopped 01/20/19 0659)  ketorolac (TORADOL) 15 MG/ML injection 15 mg (15 mg Intravenous Given 01/20/19 0110)                                                                                                                                    Procedures .Critical Care Performed by: Amanda Blank, MD Authorized by: Amanda Blank, MD    CRITICAL CARE Performed by: Grayce Sessions Terence Bart Total critical care time: 35 minutes Critical care time was exclusive of separately billable procedures and treating other patients. Critical care was necessary to treat or prevent imminent or life-threatening deterioration. Critical care was time spent personally by me on the following activities: development of treatment plan with patient and/or surrogate as well as nursing, discussions with consultants, evaluation of patient's response to treatment, examination of patient, obtaining history from patient or surrogate, ordering and performing treatments and interventions, ordering and review of laboratory studies, ordering and review of radiographic studies, pulse oximetry and re-evaluation of patient's condition.    (including critical care time)  Medical Decision Making / ED Course I have reviewed the nursing notes for this encounter and the patient's prior records (if available in EHR or on provided paperwork).   Kyana Chesler was evaluated in Emergency  Department on 01/20/2019 for the symptoms described in the history of present illness. She was evaluated in the context of the global COVID-19 pandemic, which necessitated consideration that the patient might be at risk for infection with the SARS-CoV-2 virus that causes COVID-19. Institutional protocols and algorithms that pertain to the evaluation of patients at risk for COVID-19 are in a state of rapid change based on information released by regulatory bodies including the CDC and federal and state organizations. These policies and algorithms were followed during the patient's care in the ED.  Metromenorrhagia. Symptomatic with fatigue and DOE. Mildly tachycardic.  CBC with 1.2 gram drop in less than 24 hrs (seen image of OSH CBC above). Provided with  IVF. Given that she is symptomatic from her anemia, we discussed transfusion in the ED, which we was amiable to.  1U pRBCs transfused. HB up to 8.3. gave 10 mg Provera. Rx for 5 days worth. Instructed to call Gyn tomorrow to ensure they are ok with her taking it again.  The patient appears reasonably screened and/or stabilized for discharge and I doubt any other medical condition or other [facility] requiring further screening, evaluation, or treatment in the ED at this time prior to discharge.  The patient is safe for discharge with strict return precautions.        Final Clinical Impression(s) / ED Diagnoses Final diagnoses:  Abnormal uterine bleeding (AUB)  Acute blood loss anemia     The patient appears reasonably screened and/or stabilized for discharge and I doubt any other medical condition or other [facility] requiring further screening, evaluation, or treatment in the ED at this time prior to discharge.  Disposition: Discharge  Condition: Good  I have discussed the results, Dx and Tx plan with the patient who expressed understanding and agree(s) with the plan. Discharge instructions discussed at great length. The patient was given strict  return precautions who verbalized understanding of the instructions. No further questions at time of discharge.    ED Discharge Orders         Ordered    medroxyPROGESTERone (PROVERA) 10 MG tablet  Daily     01/20/19 2376            Follow Up: Gynecology  Schedule an appointment as soon as possible for a visit       This chart was dictated using voice recognition software.  Despite best efforts to proofread,  errors can occur which can change the documentation meaning.   Amanda Blank, MD 01/20/19 2831    Amanda Blank, MD 02/22/19 [phone]

## 2019-01-20 NOTE — ED Notes (Addendum)
Pt ambulated independently to the restroom and back with steady gait. Afterwards, she reported dizziness and dyspnea.

## 2019-01-21 LAB — TYPE AND SCREEN
ABO/RH(D): O POS
Antibody Screen: NEGATIVE
Unit division: 0

## 2019-01-21 LAB — BPAM RBC
Blood Product Expiration Date: 202009022359
ISSUE DATE / TIME: 202008090248
Unit Type and Rh: 5100

## 2019-02-05 ENCOUNTER — Ambulatory Visit (INDEPENDENT_AMBULATORY_CARE_PROVIDER_SITE_OTHER): Payer: BC Managed Care – PPO | Admitting: Psychiatry

## 2019-02-05 ENCOUNTER — Other Ambulatory Visit: Payer: Self-pay

## 2019-02-05 DIAGNOSIS — F411 Generalized anxiety disorder: Secondary | ICD-10-CM

## 2019-02-05 NOTE — Progress Notes (Signed)
[facility] Counselor/Therapist Progress Note  [name], MRN: [id number],    Date: 02/05/2019  Time Spent:  60 minutes    8:00am to 9:00am  Treatment Type: Individual Therapy   Virtual Visit Note I connected with patient by a video enabled telemedicine/telehealth application or telephone, with their informed consent, and verified patient privacy and that I am speaking with the correct person using two identifiers. I am at [facility] and patient is at her home.   I discussed the limitations, risks, security and privacy concerns of performing psychotherapy and management service by telephone and the availability of in person appointments. I also discussed with the patient that there may be a patient responsible charge related to this service. The patient expressed understanding and agreed to proceed.  I discussed the treatment planning with the patient. The patient was provided an opportunity to ask questions and all were answered. The patient agreed with the plan and demonstrated an understanding of the instructions.   The patient was advised to call  our office if  symptoms worsen or feel they are in a crisis state and need immediate contact.   Reported Symptoms:  Depression (much less), anxious (increased), irritability,tiredness, sometimes overwhelmed  Mental Status Exam:  Appearance:   N/A  (telehealth)  Behavior:  sharing  Motor:  Normal  (per patient--telehealth)  Speech/Language:   Normal Rate  Affect:  N/A (telehealth)  Mood:  anxious and depressed, more anxiousness and tiredness per patient  Thought process:  normal  Thought content:    WNL  Sensory/Perceptual disturbances:    WNL  Orientation:  oriented to person, place, time/date, situation, day of week, month of year and year  Attention:  Fair  Concentration:  Fair  Memory:  WNL  Fund of knowledge:   Good  Insight:    Fair  Judgment:   Fair  Impulse Control:  Good   Risk  Assessment: Danger to Self:  No Self-injurious Behavior: No Danger to Others: No Duty to Warn:no Physical Aggression / Violence:No  Access to Firearms a concern: No  Gang Involvement:No   Subjective: Patient today reports symptoms noted above and says that her primary symptom is Anxiety, and also feeling irritable at home and work.  Still having some tiredness.  Depression "is still present but not as much of a problem as anxiety.  She reports "the anxiety seems to lead to intrusive thoughts at times although not quite as bad the past couple days."  States that she sometimes "uses visualizations to help manage her intrusive thoughts which tend to be about something bad happening to someone she cares about, or either things that disgust me."  The visualizations seem to help sometimes.  Other times, she reports not having racing thoughts.  Asked about frequency but patient did not indicate, except that they had "not been happening much the past 2 days."    Patient makes it very clear that she doesn't want anyone "making suggestions to me as to how to manage what's going on with me."  States that co-workers and others frequently give her suggestions and that tends to aggravate or anger patient versus help. Anxiety is the worst "on my way to work and when I'm at work."  Reports her "sleep is good I guess, I sleep a lot."  Is using the C-pap now to sleep.  Describes being irritable a lot and especially with people, "they say things that annoy me, or ask questions, or give me suggestions to help me."  Processes some of her patterns of not relating well to other people and states that she is not wanting to broaden her friend base" which is what she says a previous therapist had been urging her to do. Feels people don't understand her and that's ok as long as they don't bother me. Patient remains very sensitive about needing  mostly "to be heard and doesn't want people to offer suggestions", so I am sensitive  about helping her realize she is heard in sessions and based on problems she reports, try to tactfully share but not force potential strategies.  Does tell me that she is feeling heard and understood as we talk, and her talk did deepen the longer we spoke, which is good for her to be increasingly open and affirmed this with patient.  (Review from prior session)  "I struggle most with general life stressors": Frustrated with society in general I'm an introvert and people always assume you need to be with others all the time Frustrated with my thoughts, they are jumbled up, I don't know where they're going,not concrete Don't always know how I feel about things Sometimes can't get the words out to explain my thoughts Frustrated at times, but can't wrap my head around why I'm frustrated Concern about my tiredness  Doesn't talk openly with many people When I talk with you, I don't have to be embarrassed nor wear a mask nor pretend  Goal review, per Flowsheets, with patient and  some progress noted with patient as she works further on self esteem, thought patterns (negative vs positive) and encouraged overall self-care, and less self-negating. She doesn't really look at thoughts "as being positive or negative", but rather are they "realistic".  She feels she able to make some progress  ** Primary need per patient is to continue to be heard as she reports long history in therapy and feels like people were always "wanting me to change when I didn't want to change some things that were suggested."  Interventions: Cognitive Behavioral Therapy and Solution-Oriented/Positive Psychology  Diagnosis:   ICD-10-CM   1. Generalized anxiety disorder  F41.1     Plan:    To continue being very open in sessions to vent and to process her concerns, and especially in trying to gain a more hopeful outlook, and to consider possible changes that may benefit her. Goal review with patient, per Flowsheets.  Working to  have more positive self-talk as she decreases the negative, limiting, messages to herself.  Next session to be within 2 wks.  Shanon Ace, LCSW

## 2019-02-11 ENCOUNTER — Telehealth: Payer: Self-pay | Admitting: Physician Assistant

## 2019-02-11 ENCOUNTER — Other Ambulatory Visit: Payer: Self-pay

## 2019-02-11 MED ORDER — FLUOXETINE HCL 40 MG PO CAPS: 40.0000 mg | ORAL_CAPSULE | Freq: Every day | ORAL | 0 refills | Status: DC

## 2019-02-11 MED ORDER — CLONAZEPAM 0.5 MG PO TABS: 0.2500 mg | ORAL_TABLET | Freq: Two times a day (BID) | ORAL | 0 refills | Status: DC | PRN

## 2019-02-11 MED ORDER — FLUOXETINE HCL 20 MG PO CAPS: 20.0000 mg | ORAL_CAPSULE | Freq: Every day | ORAL | 0 refills | Status: DC

## 2019-02-11 MED ORDER — MODAFINIL 200 MG PO TABS: 100.0000 mg | ORAL_TABLET | Freq: Every day | ORAL | 0 refills | Status: DC | PRN

## 2019-02-11 NOTE — Telephone Encounter (Signed)
Increase Prozac to 60 mg qd.  Please send in 30 day supply. Have her take the Provigil qd, not just work days. Is she on the Propranolol for BP? Or anxiety, migraine prevention?  That can cause extreme fatigue in some people.  If the fatigue/sleepiness started at same she started this, then she should talk to the prescriber about it.  Also check her pulse and BP. If low (pulse < 50) definitely needs to discuss w/ prescriber.

## 2019-02-11 NOTE — Telephone Encounter (Signed)
Pt called. Having a hard time. Cant' stay awake,confused,anxiety really bad. Been coming on. Last week at work was much worse. Has appt 9/4.

## 2019-02-11 NOTE — Telephone Encounter (Signed)
ok 

## 2019-02-12 ENCOUNTER — Telehealth: Payer: Self-pay

## 2019-02-12 NOTE — Telephone Encounter (Signed)
Prior authorization submitted and approved for Modafinil 200 mg through covermymeds with [employer] effective 02/12/2019-02/10/2022

## 2019-02-15 ENCOUNTER — Ambulatory Visit (INDEPENDENT_AMBULATORY_CARE_PROVIDER_SITE_OTHER): Payer: BC Managed Care – PPO | Admitting: Physician Assistant

## 2019-02-15 ENCOUNTER — Other Ambulatory Visit: Payer: Self-pay

## 2019-02-15 ENCOUNTER — Encounter: Payer: Self-pay | Admitting: Physician Assistant

## 2019-02-15 ENCOUNTER — Encounter

## 2019-02-15 DIAGNOSIS — F411 Generalized anxiety disorder: Secondary | ICD-10-CM

## 2019-02-15 DIAGNOSIS — R400 Somnolence: Secondary | ICD-10-CM

## 2019-02-15 DIAGNOSIS — F331 Major depressive disorder, recurrent, moderate: Secondary | ICD-10-CM | POA: Diagnosis not present

## 2019-02-15 DIAGNOSIS — R5383 Other fatigue: Secondary | ICD-10-CM

## 2019-02-15 MED ORDER — FLUOXETINE HCL 60 MG PO TABS: 60.0000 mg | ORAL_TABLET | Freq: Every day | ORAL | 1 refills | Status: DC

## 2019-02-15 MED ORDER — HYDROXYZINE HCL 25 MG PO TABS: 12.5000 mg | ORAL_TABLET | Freq: Three times a day (TID) | ORAL | 1 refills | Status: DC | PRN

## 2019-02-15 NOTE — Progress Notes (Signed)
[facility]  [name],  MRN: [id number]  PCP: Rochel Brome, MD  Date of Evaluation: 02/15/2019 Time spent:15 minutes  Chief Complaint:  Chief Complaint    Follow-up     Virtual Visit via Telephone Note  I connected with patient by a video enabled telemedicine application or telephone, with their informed consent, and verified patient privacy and that I am speaking with the correct person using two identifiers.  I am private, in my home and the patient is home.  I discussed the limitations, risks, security and privacy concerns of performing an evaluation and management service by telephone and the availability of in person appointments. I also discussed with the patient that there may be a patient responsible charge related to this service. The patient expressed understanding and agreed to proceed.   I discussed the assessment and treatment plan with the patient. The patient was provided an opportunity to ask questions and all were answered. The patient agreed with the plan and demonstrated an understanding of the instructions.   The patient was advised to call back or seek an in-person evaluation if the symptoms worsen or if the condition fails to improve as anticipated.  I provided 15 minutes of non-face-to-face time during this encounter.  HISTORY/CURRENT STATUS: HPI For routine med check.   Has more anxiety.  Gets really anxious when thinking about some things, like bugs.  Denies rash, but says she itches a lot, once she gets really anxious. Has been going on for about 2 months.  Has had this in the past. Hydroxyzine helps a little.    Sleeps a lot and is tired a lot.  Works 3 days a week but the rest of the time she does not want to do anything around the house.  She does take care of her own hygiene but does not clean the house a lot or anything she does not absolutely have to do.  She understands that part of her symptoms can be due to the  anemia.  Her hemoglobin is somewhere around 8.  Does feel that the modafinil has given her some energy, especially on her workdays.  States she does not think she could make it if it was not for that.  She does isolate when not at work.  In the past, she has had to take some time off work due to the extreme anxiety and depression.  Denies suicidal or homicidal thoughts.  Patient denies increased energy with decreased need for sleep, no increased talkativeness, no racing thoughts, no impulsivity or risky behaviors, no increased spending, no increased libido, no grandiosity.  Denies dizziness, syncope, seizures, numbness, tingling, tremor, tics, unsteady gait, slurred speech, confusion. Denies muscle or joint pain, stiffness, or dystonia.  Individual Medical History/ Review of Systems: Changes? :Yes itching.  Anemia  Past medications for mental health diagnoses include: Lamictal, Abilify, Prozac, Zoloft, Celexa, Lexapro, Paxil, Cymbalta, Wellbutrin, lithium, Depakote, Seroquel, Risperdal, Zyprexa, gabapentin, Klonopin, Xanax, trazodone, BuSpar, Luvox, Ativan  Allergies: Oxycodone-acetaminophen and Nitrofurantoin  Current Medications:  Current Outpatient Medications:  .  clonazePAM (KLONOPIN) 0.5 MG tablet, Take 0.5-1 tablets (0.25-0.5 mg total) by mouth 2 (two) times daily as needed for anxiety., Disp: 60 tablet, Rfl: 0 .  Erenumab-aooe (AIMOVIG) 140 MG/ML SOAJ, Inject 140 mg into the skin every 30 (thirty) days., Disp: 1 pen, Rfl: 11 .  ferrous sulfate 325 (65 FE) MG tablet, Take 325 mg by mouth 2 (two) times daily with a meal., Disp: , Rfl:  .  modafinil (PROVIGIL) 200 MG tablet, Take 0.5 tablets (100 mg total) by mouth daily as needed., Disp: 30 tablet, Rfl: 0 .  propranolol ER (INDERAL LA) 80 MG 24 hr capsule, TAKE ONE CAPSULE BY MOUTH AT BEDTIME (Patient taking differently: Take 80 mg by mouth at bedtime. ), Disp: 180 capsule, Rfl: 6 .  rizatriptan (MAXALT-MLT) 10 MG disintegrating tablet,  Take 1 tablet (10 mg total) by mouth as needed for migraine. May repeat in 2 hours if needed, Disp: 9 tablet, Rfl: 11 .  amLODipine (NORVASC) 5 MG tablet, Take 1 tablet (5 mg total) by mouth daily. For high blood pressure (Patient not taking: Reported on 08/20/2018), Disp: 30 tablet, Rfl: 0 .  FLUoxetine HCl 60 MG TABS, Take 60 mg by mouth daily., Disp: 30 tablet, Rfl: 1 .  hydrOXYzine (ATARAX/VISTARIL) 25 MG tablet, Take 0.5-1 tablets (12.5-25 mg total) by mouth 3 (three) times daily as needed., Disp: 60 tablet, Rfl: 1 .  medroxyPROGESTERone (PROVERA) 10 MG tablet, Take 1 tablet (10 mg total) by mouth daily for 5 days., Disp: 5 tablet, Rfl: 0 Medication Side Effects: none  Family Medical/ Social History: Changes? No  MENTAL HEALTH EXAM:  There were no vitals taken for this visit.There is no height or weight on file to calculate BMI.  General Appearance: unable to assess  Eye Contact:  unable to assess  Speech:  Clear and Coherent  Volume:  Normal  Mood:  Depressed  Affect:  Unable to assess  Thought Process:  Goal Directed  Orientation:  Full (Time, Place, and Person)  Thought Content: Logical   Suicidal Thoughts:  No  Homicidal Thoughts:  No  Memory:  WNL  Judgement:  Good  Insight:  Good  Psychomotor Activity:  Unable to assess  Concentration:  Concentration: Good  Recall:  Good  Fund of Knowledge: Good  Language: Good  Assets:  Desire for Improvement  ADL's:  Intact  Cognition: WNL  Prognosis:  Good    DIAGNOSES:    ICD-10-CM   1. Major depressive disorder, recurrent episode, moderate (HCC)  F33.1   2. Generalized anxiety disorder  F41.1   3. Somnolence  R40.0   4. Fatigue, unspecified type  R53.83     Receiving Psychotherapy: No    RECOMMENDATIONS:  If needed, will do FMLA if anxiety and depression don't improve in next few weeks.  Increase Prozac to 60 mg p.o. daily. Continue Klonopin 0.5 mg 1/2-1 twice daily as needed.  PDMP was reviewed. Continue hydroxyzine  25 mg, 1/2-1 p.o. 3 times daily as needed. Continue modafinil 200 mg, one half p.o. every morning PRN. Continue propranolol ER 80 mg nightly. Recommend therapy. Return in 6 weeks.  Donnal Moat, PA-C   This record has been created using [employer].  Chart creation errors have been sought, but may not always have been located and corrected. Such creation errors do not reflect on the standard of medical care.

## 2019-02-22 ENCOUNTER — Ambulatory Visit (INDEPENDENT_AMBULATORY_CARE_PROVIDER_SITE_OTHER): Payer: BC Managed Care – PPO | Admitting: Psychiatry

## 2019-02-22 ENCOUNTER — Other Ambulatory Visit: Payer: Self-pay

## 2019-02-22 DIAGNOSIS — F331 Major depressive disorder, recurrent, moderate: Secondary | ICD-10-CM | POA: Diagnosis not present

## 2019-02-22 NOTE — Progress Notes (Signed)
[facility] Counselor/Therapist Progress Note  [name], MRN: [id number],    Date: 02/22/2019  Time Spent:  60 minutes    11:00am to 12:00noon  Treatment Type: Individual Therapy    Virtual Visit Note Connected with patient by a video enabled telemedicine/telehealth application or telephone, with their informed consent, and verified patient privacy and that I am speaking with the correct person using two identifiers. I discussed the limitations, risks, security and privacy concerns of performing psychotherapy and management service by telephone and the availability of in person appointments. I also discussed with the patient that there may be a patient responsible charge related to this service. The patient expressed understanding and agreed to proceed. I discussed the treatment planning with the patient. The patient was provided an opportunity to ask questions and all were answered. The patient agreed with the plan and demonstrated an understanding of the instructions. The patient was advised to call  our office if  symptoms worsen or feel they are in a crisis state and need immediate contact.   Therapist Location: [facility] Patient Location: home   Reported Symptoms: anxiety, depression, frustrated, some irritability  Mental Status Exam:  Appearance:   n/a   telehealth     Behavior:  Appropriate and Sharing  Motor:  n/a   telehealth  Speech/Language:   Clear and Coherent  Affect:  Depressed and anxious  Mood:  anxious and depressed  Thought process:  goal directed  Thought content:    WNL  Sensory/Perceptual disturbances:    WNL  Orientation:  oriented to person, place, time/date, situation, day of week, month of year and year  Attention:  Good  Concentration:  Good  Memory:  WNL  Fund of knowledge:   Good  Insight:    Good  Judgment:   Good  Impulse Control:  Good   Risk Assessment: Danger to Self:  No Self-injurious Behavior:  No Danger to Others: No Duty to Warn:no Physical Aggression / Violence:No  Access to Firearms a concern: No  Gang Involvement:No   Subjective:  Patient anxious, depressed, and sometimes overwhelmed.  Still having some physical concerns and doesn't have many answers.  Did state her med provider has started her on an additional medication, a "stimulant that helps me wake up and be some more alert until about 8 hrs."  Does communicate with online women's support group. States it helps some but she gets angry because "I do not want suggestion nor advice from online friends or anyone. Does say it helps her to talk things through in our sessions and seems very accepting of support and feedback.  Reports there are still uncertainties for her re: medical issues and not sure how they may be impacting her emotionally.  In asking about self-talk, she explained she doesn't have positive and doesn't like that word but "does try to live with whatever her life is."   Interventions: Solution-Oriented/Positive Psychology and Ego-Supportive  Diagnosis:   ICD-10-CM   1. Major depressive disorder, recurrent episode, moderate ([place])  F33.1     Plan:   Treatment Goals: Patient not signing tx plan on computer screen due to [place].  Long term goal: Develop healthy cognitive patterns and beliefs about self and the world that lead to alleviation of depression and anxiety and help prevent relapse.  Progressing:  Working to elevate patient's motivation and she is engaging some.   Short term goal: Verbally express understanding of the relationship between depressed mood and repression of feelings- such  as anger, hurt, and sadness.  Strategy: Patient will learn more about how repression of feelings such as anger, resentment, hurt, and sadness often result in depressed mood. She will also understand and accept that some sadness, anger, and hurt is a normal part of life and can be managed in healthy  ways.  Progressing: Patient is responding better and allowing herself to be a little more engaged in therapy process and goals.  Building trust has taken some time for her, understandably due to her history and some current situations/relationships.  Next appt within 2 weeks    Shanon Ace, LCSW

## 2019-03-08 ENCOUNTER — Ambulatory Visit (INDEPENDENT_AMBULATORY_CARE_PROVIDER_SITE_OTHER): Payer: BC Managed Care – PPO | Admitting: Psychiatry

## 2019-03-08 ENCOUNTER — Other Ambulatory Visit: Payer: Self-pay

## 2019-03-08 DIAGNOSIS — F331 Major depressive disorder, recurrent, moderate: Secondary | ICD-10-CM | POA: Diagnosis not present

## 2019-03-08 NOTE — Progress Notes (Signed)
[facility] Counselor/Therapist Progress Note  [name], MRN: [id number],    Date: 03/08/2019  Time Spent:  60 minutes   9:00am to 10:00am  Treatment Type: Individual Therapy     Virtual Visit Note Connected with patient by a video enabled telemedicine/telehealth application or telephone, with their informed consent, and verified patient privacy and that I am speaking with the correct person using two identifiers. I discussed the limitations, risks, security and privacy concerns of performing psychotherapy and management service by telephone and the availability of in person appointments. I also discussed with the patient that there may be a patient responsible charge related to this service. The patient expressed understanding and agreed to proceed. I discussed the treatment planning with the patient. The patient was provided an opportunity to ask questions and all were answered. The patient agreed with the plan and demonstrated an understanding of the instructions. The patient was advised to call  our office if  symptoms worsen or feel they are in a crisis state and need immediate contact.   Therapist Location: [facility] Patient Location: home   Reported Symptoms:  anxiety, depression, irritability, difficulty managing things with 65 yr old daughter with behavioral health concerns  Mental Status Exam:  Appearance:   n/a   telehealth     Behavior:  Sharing  Motor:  n/a   telehealth  Speech/Language:   Normal Rate  Affect:  n/a  telehealth  Mood:  anxious, depressed and irritable  Thought process:  goal directed  Thought content:    WNL  Sensory/Perceptual disturbances:    WNL  Orientation:  oriented to person, place, time/date, situation, day of week, month of year and year  Attention:  Good  Concentration:  Good  Memory:  WNL  Fund of knowledge:   Good  Insight:    Good  Judgment:   Good  Impulse Control:  Good   Risk  Assessment: Danger to Self:  No Self-injurious Behavior: No Danger to Others: No Duty to Warn:no Physical Aggression / Violence:No  Access to Firearms a concern: No  Gang Involvement:No   Subjective:  Patient reports being stressed and overwhelmed with work, home, her younger daughter.  Stressful to get her ready and out the door any time they go anywhere.  Daughter, age 22, stressed and overly anxious and has been in treatment.  Anxious, depressed "sometimes not as bad and other times about the same", got a break last night as she didn't have to work due to patient census being lower. Some issues she brought up from past re: parent's not allowing show of emotion and processed patient recognized how that has "stunted" her in terms of being comfortable with her emotions.    Interventions: Cognitive Behavioral Therapy and Ego-Supportive  Diagnosis:   ICD-10-CM   1. Major depressive disorder, recurrent episode, moderate ([place])  F33.1       Plan:  Patient not signing tx plan on computer screen due to [place].   Treatment Goals: Patient not signing tx plan on computer screen due to [place].  Long term goal: Develop healthy cognitive patterns and beliefs about self and the world that lead to alleviation of depression and anxiety and help prevent relapse.   Short term goal: Verbally express understanding of the relationship between depressed mood and repression of feelings- such as anger, hurt, and sadness.  Strategy: Patient will learn more about how repression of feelings such as anger, resentment, hurt, and sadness often result in  depressed mood. She will also understand and accept that some sadness, anger, and hurt is a normal part of life and can be managed in healthy ways.  Progressing: Patient showing more motivation in therapy session at some at home.  More engaged and able to focus on goals.  Having a difficult time with 32 yr old daughter who has behavioral health challenges.   Some grief over soon expected death of maternal grandmother which she processed at length today, as there is a history of dysfunctional relationship concerns there.  Patient showed a deeper insight into those issues re: not showing emotions nor other feelings and how parents handled that when she was a child.  This actually led into some of her current goal-directed strategies especially dealing with repressed feelings that easily lead to depression and anxiety, which we worked on today.  Some progress made in follow through on goal-related activity and patient actually sensed she was a little better today, although stressed with some home situations.  Building trust takes time and especially with her history, and she did well today in focusing on goals and things that needed to be addressed outside of her goals (grandmother's critical health status.)  Also showed some increased motivation at home by doing some deep cleaning in 1 room and felt really good about that, plans to tackle another room this weekend.    Next appt in 2 wks.   Shanon Ace, LCSW

## 2019-03-25 ENCOUNTER — Ambulatory Visit (INDEPENDENT_AMBULATORY_CARE_PROVIDER_SITE_OTHER): Payer: BC Managed Care – PPO | Admitting: Psychiatry

## 2019-03-25 ENCOUNTER — Other Ambulatory Visit: Payer: Self-pay

## 2019-03-25 DIAGNOSIS — F331 Major depressive disorder, recurrent, moderate: Secondary | ICD-10-CM

## 2019-03-25 NOTE — Progress Notes (Addendum)
[facility] Counselor/Therapist Progress Note  [name], MRN: [id number],    Date: 03/25/2019  Time Spent: 60 minutes    8:00am to 9:00am  Virtual Visit Note Connected with patient by a video enabled telemedicine/telehealth application or telephone, with their informed consent, and verified patient privacy and that I am speaking with the correct person using two identifiers. I discussed the limitations, risks, security and privacy concerns of performing psychotherapy and management service by telephone and the availability of in person appointments. I also discussed with the patient that there may be a patient responsible charge related to this service. The patient expressed understanding and agreed to proceed. I discussed the treatment planning with the patient. The patient was provided an opportunity to ask questions and all were answered. The patient agreed with the plan and demonstrated an understanding of the instructions. The patient was advised to call  our office if  symptoms worsen or feel they are in a crisis state and need immediate contact.   Therapist Location: [facility] Patient Location: home   Treatment Type: Individual Therapy  Reported Symptoms:  "Overwhelmed at home with husband and daughter and their contstant demand". Some depressed mood and frustration.  Communication with them is not the best as they do tend to be demanding.   Mental Status Exam:  Appearance:    n/a   telehealth     Behavior:  Sharing  Motor:   n/a    telehealth  Speech/Language:   Normal Rate  Affect:  n/a   telehealth  Mood:  anxious, depressed and "but some better"   Thought process:  normal  Thought content:    WNL  Sensory/Perceptual disturbances:    WNL  Orientation:  oriented to person, place, time/date, situation, day of week, month of year and year  Attention:  Good  Concentration:  Good  Memory:  WNL  Fund of knowledge:   Good  Insight:     Good  Judgment:   Good  Impulse Control:  Good   Risk Assessment: Danger to Self:  No Self-injurious Behavior: No Danger to Others: No Duty to Warn:no Physical Aggression / Violence:No  Access to Firearms a concern: No  Gang Involvement:No    Subjective:   Patient today reports a rough weekend at home most recently but the prior weekend was better---states home life can be unpredictable between her husband and 41 yr old daughter who struggles with autism. (daughter is in treatment) Grandmother died approximately 2 weeks ago and they had been expecting her death. She had a stroke months ago and declined from there. Stressful trust ssues within marriage. (not all details in note due to patient's privacy needs.) They are trying to process verbally and "work some things out".  Patient also reports that she has felt better at times since our last appt, Has not been over-sleeping as much, getting a few more things done at home, hasn't been as aggravated at work, and her headaches have not been as frequent nor as painful.    Interventions: Cognitive Behavioral Therapy and Ego-Supportive  Diagnosis:   ICD-10-CM   1. Major depressive disorder, recurrent episode, moderate ([place])  F33.1      Plan: Patient not signing tx plan on computer screen due to [place].   Treatment Goals: Goals remain on tx plan as patient works on strategies to meet her goals.  Progress is documented each session in "Progress" section of Plan.  Long term goal: Develop healthy cognitive  patterns and beliefs about self and the world that lead to alleviation of depression and anxiety and help prevent relapse.   Short term goal: Verbally express understanding of the relationship between depressed mood and repression of feelings- such as anger, hurt, and sadness.  Strategy: Patient will learn more about how repression of feelings such as anger, resentment, hurt, and sadness often result in depressed mood. She will  also understand and accept that some sadness, anger, and hurt is a normal part of life and can be managed in healthy ways.  Progressing: Patient continues to have some more motivation, not at a high level yet, but definitely an increased level for this patient which is progress for her.  Processed her thoughts/feelings about grandmother's death and states she is getting "some peace with that and knows she's not in pain or suffering."  Patient expressing some healthier thought patterns in reference to her grandmother's death; thoughts related in some ways to her short term goal and strategy to achieve it.  Seems to be increasingly aware of her thoughts and now looking at what feelings tend to result from her thoughts. Currently working to identify the connection between anxious or negative thoughts and how she ends up feeling more depressed or more anxious. Patient actually sensing she is a little better and reports her faith is a big support for her as well.  Next appt in approx 2 weeks.   Shanon Ace, LCSW

## 2019-04-08 ENCOUNTER — Ambulatory Visit (INDEPENDENT_AMBULATORY_CARE_PROVIDER_SITE_OTHER): Payer: BC Managed Care – PPO | Admitting: Psychiatry

## 2019-04-08 ENCOUNTER — Other Ambulatory Visit: Payer: Self-pay

## 2019-04-08 DIAGNOSIS — F331 Major depressive disorder, recurrent, moderate: Secondary | ICD-10-CM | POA: Diagnosis not present

## 2019-04-08 NOTE — Progress Notes (Signed)
[facility] Counselor/Therapist Progress Note  Patient ID: Raphaelle Prevette, MRN: [id number],    Date: 04/08/2019  Time Spent: 60 minutes  8:00am to 9:00am  Virtual Visit Note Connected with patient by a video enabled telemedicine/telehealth application or telephone, with their informed consent, and verified patient privacy and that I am speaking with the correct person using two identifiers. I discussed the limitations, risks, security and privacy concerns of performing psychotherapy and management service by telephone and the availability of in person appointments. I also discussed with the patient that there may be a patient responsible charge related to this service. The patient expressed understanding and agreed to proceed. I discussed the treatment planning with the patient. The patient was provided an opportunity to ask questions and all were answered. The patient agreed with the plan and demonstrated an understanding of the instructions. The patient was advised to call  our office if  symptoms worsen or feel they are in a crisis state and need immediate contact.   Therapist Location: [facility] Patient Location: home  Treatment Type: Individual Therapy  Reported Symptoms: depression, anxiety, "some days are better and some are worse", " I get bombarded with the thoughts that make me depressed" (thoughts about anything bad happening to her child especially when something bad has happened in the news"); trust issues with others in family (not all details in note due to patient's privacy needs)  Mental Status Exam:  Appearance:   n/a   telehealth     Behavior:  Sharing  Motor:  n/a  telehealth  Speech/Language:   Normal Rate  Affect:  n/a  telehealth  Mood:  anxious and depressed  Thought process:  goal directed  Thought content:    WNL  Sensory/Perceptual disturbances:    WNL  Orientation:  oriented to person, place, time/date, situation, day of week, month of  year and year  Attention:  Good  Concentration:  Good  Memory:  WNL  Fund of knowledge:   Good  Insight:    Fair  Judgment:   Good  Impulse Control:  Good   Risk Assessment: Danger to Self:  No Self-injurious Behavior: No Danger to Others: No Duty to Warn:no Physical Aggression / Violence:No  Access to Firearms a concern: No  Gang Involvement:No   Subjective: Patient today reporting depression and anxiety that "sometimes come and go depending on what's happening and what I'm thinking", imagines horrible thing happening sometimes when she hears in the news of "bad things happening to others".  Marital challenges mixed with trust issues and patient talks through some of her concerns about sensitive thoughts.  Interventions: Solution-Oriented/Positive Psychology and Ego-Supportive; CBT  Diagnosis:   ICD-10-CM   1. Major depressive disorder, recurrent episode, moderate ([place])  F33.1     Plan: Patient not signing tx plan on computer screen due to [place].  Treatment Goals: Goals remain on tx plan as patient works on strategies to meet her goals.  Progress is documented each session in "Progress" section of Plan.  Long term goal: Develop healthy cognitive patterns and beliefs about self and the world that lead to alleviation of depression and anxiety and help prevent relapse.  Short term goal: Verbally express understanding of the relationship between depressed mood and repression of feelings- such as anger, hurt, and sadness.  Strategy: Patient will learn more about how repression of feelings such as anger, resentment, hurt, and sadness often result in depressed mood. She will also understand and accept that some sadness,  anger, and hurt is a normal part of life and can be managed in healthy ways.  Progressing: Patient today states she feels in some ways she is better---is more openly talking about her concerns "as I'm so much an introvert and don't normally talk about  some of the things we talk about in sessions."  She also states "based on my functionality, I'm better because I do get up and function at work or with family even if things are not all fine."  We worked today more on her short term goal in connecting how her repression of feelings over the years contribute to her depressed mood.  Patient receptive and states she is becoming less guarded when we talk but doesn't have a lot of outside support.  Talked about repressed feelings of anger and distrust today and she was able to see how those would contribute to her depression. To continue this next session and patient and is to self-monitor her thoughts especially thoughts during her more difficult times, and we will process those more next session.  Validated her openness today as that is not easy for patient.  Motivation a little better. Goal review and progress noted.   Next appt within 3 weeks.   Shanon Ace, LCSW

## 2019-05-03 ENCOUNTER — Ambulatory Visit (INDEPENDENT_AMBULATORY_CARE_PROVIDER_SITE_OTHER): Payer: BC Managed Care – PPO | Admitting: Psychiatry

## 2019-05-03 ENCOUNTER — Other Ambulatory Visit: Payer: Self-pay

## 2019-05-03 DIAGNOSIS — F331 Major depressive disorder, recurrent, moderate: Secondary | ICD-10-CM

## 2019-05-03 NOTE — Progress Notes (Signed)
[facility] Counselor/Therapist Progress Note  [name], MRN: [id number],    Date: 05/03/2019  Time Spent: 60 minutes  9:00am to 10:00am  Treatment Type: Individual Therapy   Virtual Visit Note Connected with patient by a video enabled telemedicine/telehealth application or telephone, with their informed consent, and verified patient privacy and that I am speaking with the correct person using two identifiers. I discussed the limitations, risks, security and privacy concerns of performing psychotherapy and management service by telephone and the availability of in person appointments. I also discussed with the patient that there may be a patient responsible charge related to this service. The patient expressed understanding and agreed to proceed. I discussed the treatment planning with the patient. The patient was provided an opportunity to ask questions and all were answered. The patient agreed with the plan and demonstrated an understanding of the instructions. The patient was advised to call  our office if  symptoms worsen or feel they are in a crisis state and need immediate contact.   Therapist Location: [facility] Patient Location: home   Reported Symptoms: depression, overwhelmed, anxiety, "feel like I'm on autopilot"  Mental Status Exam:  Appearance:   n/a  telehealth     Behavior:  Appropriate and Sharing  Motor:  n/a  telehealth  Speech/Language:   n/a   telehealth  Affect:  N/a  telehealth  Mood:  anxious, depressed   Thought process:  goal directed  Thought content:    WNL  Sensory/Perceptual disturbances:    WNL  Orientation:  oriented to person, place, time/date, situation, day of week, month of year and year  Attention:  Good  Concentration:  Good  Memory:  Pt reports some short term memory "forgetfulness" especially under stress  Fund of knowledge:   Good  Insight:    Good  Judgment:   Fair  Impulse Control:  Fair   Risk  Assessment: Danger to Self:  No Self-injurious Behavior: No Danger to Others: No Duty to Warn:no Physical Aggression / Violence:No  Access to Firearms a concern: No  Gang Involvement:No   Subjective: Patient today feeling depressed, anxious, overwhelmed  Interventions: Cognitive Behavioral Therapy and Solution-Oriented/Positive Psychology  Diagnosis:   ICD-10-CM   1. Major depressive disorder, recurrent episode, moderate ([place])  F33.1      Plan: Patient not signing tx plan on computer screen due to [place].  Treatment Goals: Goals remain on tx plan as patient works on strategies to meet her goals. Progress is documented each session in "Progress" section of Plan.  Long term goal: Develop healthy cognitive patterns and beliefs about self and the world that lead to alleviation of depression and anxiety and help prevent relapse.  Short term goal: Verbally express understanding of the relationship between depressed mood and repression of feelings- such as anger, hurt, and sadness.  Strategy: Patient will learn more about how repression of feelings such as anger, resentment, hurt, and sadness often result in depressed mood. She will also understand and accept that some sadness, anger, and hurt is a normal part of life and can be managed in healthy ways.  Progressing:  Patient today is quite depressed, anxious, and overwhelmed mostly due to issues with her 18 yr old daughter (who has significant behavioral health issues), lack of help from husband, feeling that school personnel don't understand, feeling it's all on her.  Yet does not want anyone giving her suggestions, etc "as that is all I hear from school.etc".  In my work with patient, I try to stay out of that trap and have focused especially more recently on really hearing her, and helping patient feel heard and more understood, and hopefully this can contribute to her taking better care of herself and making good decisions,  while sensing that others really do care. She feels "nothing is ever about me" so I try to make therapy very much about patient. (She was not able to follow through on her thought monitoring homework.)  Today was definitely as time she needed to experience acceptance, care, and feeling important and that she matters, and I think by session end she did feel this way based on her communication to me.  She has made some progress in her goals as some of what she shared today, she often tends to repress, and that adds to her depression. (short-term goal)          Patient is to try some self-monitoring of thought between now and next session, especially in her more difficult times in hopes that she can make more of a connection between her thoughts and feelings, with the potential of eventually being able to replace negative thoughts and improve how she is feeling. Goal review and progress/efforts/perseverance noted with patient.                                Next appt in approx. 2 weeks   Shanon Ace, LCSW

## 2019-05-27 ENCOUNTER — Ambulatory Visit (INDEPENDENT_AMBULATORY_CARE_PROVIDER_SITE_OTHER): Payer: BC Managed Care – PPO | Admitting: Psychiatry

## 2019-05-27 ENCOUNTER — Other Ambulatory Visit: Payer: Self-pay

## 2019-05-27 DIAGNOSIS — F331 Major depressive disorder, recurrent, moderate: Secondary | ICD-10-CM | POA: Diagnosis not present

## 2019-05-27 NOTE — Progress Notes (Signed)
[facility] Counselor/Therapist Progress Note  [name], MRN: [id number],    Date: 05/27/2019  Time Spent: 60 minutes 12:00noon to 1:00pm  Virtual Visit Note Connected with patient by a video enabled telemedicine/telehealth application or telephone, with their informed consent, and verified patient privacy and that I am speaking with the correct person using two identifiers. I discussed the limitations, risks, security and privacy concerns of performing psychotherapy and management service by telephone and the availability of in person appointments. I also discussed with the patient that there may be a patient responsible charge related to this service. The patient expressed understanding and agreed to proceed. I discussed the treatment planning with the patient. The patient was provided an opportunity to ask questions and all were answered. The patient agreed with the plan and demonstrated an understanding of the instructions. The patient was advised to call  our office if  symptoms worsen or feel they are in a crisis state and need immediate contact.   Therapist Location: [facility] Patient Location: home  Treatment Type: Individual Therapy  Reported Symptoms: depression, anxiety, overwhelmed  Mental Status Exam:  Appearance:   n/a    telehealth     Behavior:  Sharing  Motor:  n/a   telehealth  Speech/Language:   Normal Rate  Affect:  n/a   telehealth  Mood:  anxious and depressed  Thought process:  goal directed  Thought content:    WNL  Sensory/Perceptual disturbances:    WNL  Orientation:  oriented to person, place, time/date, situation, day of week, month of year and year  Attention:  Good  Concentration:  Good  Memory:  WNL  Fund of knowledge:   Good  Insight:    Good  Judgment:   Good  Impulse Control:  Good   Risk Assessment: Danger to Self:  No Self-injurious Behavior: No Danger to Others: No Duty to Warn:no Physical  Aggression / Violence:No  Access to Firearms a concern: No  Gang Involvement:No   Subjective: Patient today reporting not feeling well due to a cold, sounding congested, but definitely wants to keep our telehealth appt.  Had a COVID test and results were negative. Depressed, anxious mostly in regards to home and personal situations.   Interventions: Solution-Oriented/Positive Psychology; Supportive  Diagnosis:   ICD-10-CM   1. Major depressive disorder, recurrent episode, moderate ([place])  F33.1      Plan: Patient not signing tx plan on computer screen due to [place].  Treatment Goals: Goals remain on tx plan as patient works on strategies to meet her goals. Progress is documented each session in "Progress" section of Plan.  Long term goal: Develop healthy cognitive patterns and beliefs about self and the world that lead to alleviation of depression and anxiety and help prevent relapse.  Short term goal: Verbally express understanding of the relationship between depressed mood and repression of feelings- such as anger, hurt, and sadness.  Strategy: Patient will learn more about how repression of feelings such as anger, resentment, hurt, and sadness often result in depressed mood. She will also understand and accept that some sadness, anger, and hurt is a normal part of life and can be managed in healthy ways.  Progressing:  Patient trying to stay focused on goals but often feeling overwhelmed with her own needs, the house with not much help, and her daughter with special needs.  Today, patient needed to use her time to vent and share her frustration and feeling overwhelmed.  Denies  any SI or HI.  Feeling alone and husband adds to the stress. Several factors contributing including family, finances, unrealistic expectations from others, lack of healthy people in her life.  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 Talked about some ways of perhaps  tapping into some support from other people, especially 1 co-worker that she has talked with some before.  Also focused on setting limits and healthy boundaries, stress management, and discussed ways to practice better self-care (physically and emotionally), and working on her self esteem/self confidence (per long term goal above).  Patient was calmer and stated she felt heard and supported in our talking today. Goal review and progress noted with patient.  Next  appt within 2-3 weeks.   Amanda Ace, LCSW

## 2019-06-04 ENCOUNTER — Ambulatory Visit: Payer: BC Managed Care – PPO | Admitting: Psychiatry

## 2019-06-09 ENCOUNTER — Other Ambulatory Visit: Payer: Self-pay | Admitting: Physician Assistant

## 2019-06-14 DIAGNOSIS — D649 Anemia, unspecified: Secondary | ICD-10-CM

## 2019-06-14 HISTORY — DX: Anemia, unspecified: D64.9

## 2019-07-11 ENCOUNTER — Encounter: Payer: Self-pay | Admitting: *Deleted

## 2019-07-15 ENCOUNTER — Other Ambulatory Visit: Payer: Self-pay

## 2019-07-15 ENCOUNTER — Ambulatory Visit: Payer: BC Managed Care – PPO | Admitting: Neurology

## 2019-07-15 ENCOUNTER — Encounter: Payer: Self-pay | Admitting: Neurology

## 2019-07-15 VITALS — BP 144/104 | HR 92 | Temp 97.3°F | Ht 62.0 in | Wt 221.0 lb

## 2019-07-15 DIAGNOSIS — R200 Anesthesia of skin: Secondary | ICD-10-CM

## 2019-07-15 DIAGNOSIS — R159 Full incontinence of feces: Secondary | ICD-10-CM

## 2019-07-15 DIAGNOSIS — R5382 Chronic fatigue, unspecified: Secondary | ICD-10-CM | POA: Diagnosis not present

## 2019-07-15 DIAGNOSIS — R32 Unspecified urinary incontinence: Secondary | ICD-10-CM

## 2019-07-15 DIAGNOSIS — R251 Tremor, unspecified: Secondary | ICD-10-CM

## 2019-07-15 DIAGNOSIS — R42 Dizziness and giddiness: Secondary | ICD-10-CM

## 2019-07-15 DIAGNOSIS — R29818 Other symptoms and signs involving the nervous system: Secondary | ICD-10-CM

## 2019-07-15 DIAGNOSIS — H539 Unspecified visual disturbance: Secondary | ICD-10-CM

## 2019-07-15 DIAGNOSIS — R202 Paresthesia of skin: Secondary | ICD-10-CM

## 2019-07-15 NOTE — Patient Instructions (Signed)
MRI of the brain and cervical spine Blood work today EMG/NCS   Electromyoneurogram Electromyoneurogram is a test to check how well your muscles and nerves are working. This procedure includes the combined use of electromyogram (EMG) and nerve conduction study (NCS). EMG is used to look for muscular disorders. NCS, which is also called electroneurogram, measures how well your nerves are controlling your muscles. The procedures are usually done together to check if your muscles and nerves are healthy. If the results of the tests are abnormal, this may indicate disease or injury, such as a neuromuscular disease or peripheral nerve damage. Tell a health care provider about:  Any allergies you have.  All medicines you are taking, including vitamins, herbs, eye drops, creams, and over-the-counter medicines.  Any problems you or family members have had with anesthetic medicines.  Any blood disorders you have.  Any surgeries you have had.  Any medical conditions you have.  If you have a pacemaker.  Whether you are pregnant or may be pregnant. What are the risks? Generally, this is a safe procedure. However, problems may occur, including:  Infection where the electrodes were inserted.  Bleeding. What happens before the procedure? Medicines Ask your health care provider about:  Changing or stopping your regular medicines. This is especially important if you are taking diabetes medicines or blood thinners.  Taking medicines such as aspirin and ibuprofen. These medicines can thin your blood. Do not take these medicines unless your health care provider tells you to take them.  Taking over-the-counter medicines, vitamins, herbs, and supplements. General instructions  Your health care provider may ask you to avoid: ? Beverages that have caffeine, such as coffee and tea. ? Any products that contain nicotine or tobacco. These products include cigarettes, e-cigarettes, and chewing tobacco.  If you need help quitting, ask your health care provider.  Do not use lotions or creams on the same day that you will be having the procedure. What happens during the procedure? For EMG   Your health care provider will ask you to stay in a position so that he or she can access the muscle that will be studied. You may be standing, sitting, or lying down.  You may be given a medicine that numbs the area (local anesthetic).  A very thin needle that has an electrode will be inserted into your muscle.  Another small electrode will be placed on your skin near the muscle.  Your health care provider will ask you to continue to remain still.  The electrodes will send a signal that tells about the electrical activity of your muscles. You may see this on a monitor or hear it in the room.  After your muscles have been studied at rest, your health care provider will ask you to contract or flex your muscles. The electrodes will send a signal that tells about the electrical activity of your muscles.  Your health care provider will remove the electrodes and the electrode needles when the procedure is finished. The procedure may vary among health care providers and hospitals. For NCS   An electrode that records your nerve activity (recording electrode) will be placed on your skin by the muscle that is being studied.  An electrode that is used as a reference (reference electrode) will be placed near the recording electrode.  A paste or gel will be applied to your skin between the recording electrode and the reference electrode.  Your nerve will be stimulated with a mild shock. Your health care  provider will measure how much time it takes for your muscle to react.  Your health care provider will remove the electrodes and the gel when the procedure is finished. The procedure may vary among health care providers and hospitals. What happens after the procedure?  It is up to you to get the results of  your procedure. Ask your health care provider, or the department that is doing the procedure, when your results will be ready.  Your health care provider may: ? Give you medicines for any pain. ? Monitor the insertion sites to make sure that bleeding stops. Summary  Electromyoneurogram is a test to check how well your muscles and nerves are working.  If the results of the tests are abnormal, this may indicate disease or injury.  This is a safe procedure. However, problems may occur, such as bleeding and infection.  Your health care provider will do two tests to complete this procedure. One checks your muscles (EMG) and another checks your nerves (NCS).  It is up to you to get the results of your procedure. Ask your health care provider, or the department that is doing the procedure, when your results will be ready. This information is not intended to replace advice given to you by your health care provider. Make sure you discuss any questions you have with your health care provider. Document Revised: 02/13/2018 Document Reviewed: 01/26/2018 Elsevier Patient Education  [place].

## 2019-07-15 NOTE — Progress Notes (Signed)
[facility]    Provider:  Dr Jaynee Eagles Requesting Provider: Irven Shelling, NP Primary Care Provider:  Rochel Brome, MD  CC:  Multiple neurologic symptoms and anxiety  HPI July 15, 2019: This is a 44 year old patient who is here as a new request by Irven Shelling, NP for paresthesias.  She has a past medical history of sleep apnea, migraines, kidney stones, hypertension, depression, chronic fatigue, anxiety disorder.  I reviewed requesting provider's notes, who reported the reason was numbness in the hands, although patient is complaining of multiple other issues today.  She is extremely anxious today, tremulous, she says she is having trouble doing everything she is losing her mind, she is having trouble working, years ago she started getting fatigued, at that time she started having numbness and tingling in both feet symmetrically, slowly progressed with longer bouts, in 2019 she got very sick and never went away. She works 3-12 hour shifts, she is off for 4 days, she just sleeps for 4 days, she goes to [employer] for her cpap, she says she is incontinence,  She has depression and anxiety she has a list: fatigue, in a fog, blurry vision, eyes jerk, headaches, confusion, dizzy, body aches, She has jolts of electricity down the neck, squeezing chest pains, numbness, tingling, hands and face numb, we recommended MRI of the brain in the past and she could not have it completed due to finances.  (Exremely anxious in the office, tremulous)  Reviewed new labs from pc paperworkp: esr,ana,ce;iac,RA were ordered, no results in paperwork, per patient normal, we will request  I also reviewed Lynn Ito notes, last time she was seen she had "numerous complaints", joint pain for the last year and a half getting worse, urinating on herself she cannot control her bladder for the past year,also BM incontinence, vision changes.   HPI:  Amanda Marks is a 44 y.o. female here  as requested by Rochel Brome, MD for headaches. Worse with periods. She has been having extended periods and is looking for an obgyn, needs a referral I will send it. She will send me an email. Started in her late teens. She gets them in her neck, pulsating, pounding, sometimes feels like a helmet on the, nausea, light and sound sensitivity, vomiting, dizziness, moving makes it worse. She has daily headaches. For the last 6 months at least 10 migraine days a month. Her migraines can be moderately severe to severe but she can be miserable, needs to lay down at work or go into a dark room which helps just a little. She had a sleep study, she had an in-home study and now going to do an in-house study. She wakes in the morning with headaches. Headaches can be positional and exertional. Migraines last for 7 days. She denies aura. No medication overuse. She is having vision changes, very blurry, she is following with an eye doctor, blurry vision ad vision loss with the headaches. She feels like she is underwater and tinnitus (not pulsatile), a roaring in the ears. She is overweight and has gained weight.Numbness and tingling in the hands. No radicular symptoms.  She has weakness. No other focal neurologic deficits, associated symptoms, inciting events or modifiable factors.  - Topamax, propranolol, imitrex, zofran, lamictal, fluoxetine, Inderal, Toradol  Reviewed notes, labs and imaging from outside physicians, which showed:  TSH normal  Reviewed Dr. Alyse Low notes.  Patient has a history of chronic headaches.  She has been on multiple medications over the past several years.  They tried you relatively which did not work.  Never saw neurologist.  She also has somnolence which began several years ago.  Fatigue, tired all the time, feels that she sleeps fine at night.  She recently had normal lab work.  She is tired driving, at home, at work, can fall asleep anywhere in any time and think she may have even fallen  asleep in the shower.  Sleep study was ordered, she had an in-home sleep test and now she is planning to have an attended sleep study.   Review of Systems: Patient complains of symptoms per HPI as well as the following symptoms: headaches, numbness and tingling, anxiety, fatigue, joint pain. Pertinent negatives and positives per HPI. All others negative.   Social History   Socioeconomic History  . Marital status: Married    Spouse name: Not on file  . Number of children: Not on file  . Years of education: Not on file  . Highest education level: Not on file  Occupational History  . Not on file  Tobacco Use  . Smoking status: Former Smoker    Types: E-cigarettes  . Smokeless tobacco: Never Used  Substance and Sexual Activity  . Alcohol use: No  . Drug use: Never  . Sexual activity: Yes    Birth control/protection: None  Other Topics Concern  . Not on file  Social History Narrative   Lives at home with husband   Right handed   Caffeine: one 20 oz diet soda per day.   Social Determinants of Health   Financial Resource Strain:   . Difficulty of Paying Living Expenses: Not on file  Food Insecurity:   . Worried About Charity fundraiser in the Last Year: Not on file  . Ran Out of Food in the Last Year: Not on file  Transportation Needs:   . Lack of Transportation (Medical): Not on file  . Lack of Transportation (Non-Medical): Not on file  Physical Activity:   . Days of Exercise per Week: Not on file  . Minutes of Exercise per Session: Not on file  Stress:   . Feeling of Stress : Not on file  Social Connections:   . Frequency of Communication with Friends and Family: Not on file  . Frequency of Social Gatherings with Friends and Family: Not on file  . Attends Religious Services: Not on file  . Active Member of Clubs or Organizations: Not on file  . Attends Archivist Meetings: Not on file  . Marital Status: Not on file  Intimate Partner Violence:   . Fear of  Current or Ex-Partner: Not on file  . Emotionally Abused: Not on file  . Physically Abused: Not on file  . Sexually Abused: Not on file    Family History  Problem Relation Age of Onset  . Hypertension Mother   . Hypertension Father   . Diabetes Father   . Migraines Neg Hx     Past Medical History:  Diagnosis Date  . Anemia, unspecified   . Anxiety disorder, unspecified   . Chronic fatigue, unspecified   . Depression   . Essential (primary) hypertension   . Kidney stones   . Migraines   . Sleep apnea, unspecified   . Vitamin D deficiency, unspecified     Patient Active Problem List   Diagnosis Date Noted  . Chronic migraine without aura without status migrainosus, not intractable 10/08/2018  . Major depression, recurrent, chronic ([place]) 04/03/2018  . Major depressive disorder,  single episode, severe ([place]) 04/02/2018    Past Surgical History:  Procedure Laterality Date  . CHOLECYSTECTOMY    . STENT PLACE LEFT URETER ([place] [facility])    . TUBAL LIGATION    . UMBILICAL HERNIA REPAIR     2015 & 2017    Current Outpatient Medications  Medication Sig Dispense Refill  . clonazePAM (KLONOPIN) 0.5 MG tablet Take 0.5-1 tablets (0.25-0.5 mg total) by mouth 2 (two) times daily as needed for anxiety. (Patient taking differently: Take 1 mg by mouth as needed for anxiety. ) 60 tablet 0  . Erenumab-aooe (AIMOVIG) 140 MG/ML SOAJ Inject 140 mg into the skin every 30 (thirty) days. 1 pen 11  . FLUoxetine HCl 60 MG TABS TAKE ONE TABLET BY MOUTH DAILY 30 tablet 1  . hydrOXYzine (ATARAX/VISTARIL) 25 MG tablet Take 0.5-1 tablets (12.5-25 mg total) by mouth 3 (three) times daily as needed. 60 tablet 1  . Iron, Ferrous Gluconate, 256 (28 Fe) MG TABS Take by mouth.    . modafinil (PROVIGIL) 200 MG tablet Take 0.5 tablets (100 mg total) by mouth daily as needed. 30 tablet 0  . propranolol ER (INDERAL LA) 80 MG 24 hr capsule TAKE ONE CAPSULE BY MOUTH AT BEDTIME (Patient taking differently: Take 80 mg  by mouth 2 (two) times daily. ) 180 capsule 6  . rizatriptan (MAXALT-MLT) 10 MG disintegrating tablet Take 1 tablet (10 mg total) by mouth as needed for migraine. May repeat in 2 hours if needed 9 tablet 11  . Vitamin D, Ergocalciferol, (DRISDOL) 1.25 MG (50000 UNIT) CAPS capsule Take 50,000 Units by mouth every 7 (seven) days.     No current facility-administered medications for this visit.    Allergies as of 07/15/2019 - Review Complete 07/15/2019  Allergen Reaction Noted  . Oxycodone-acetaminophen Nausea And Vomiting 05/18/2015  . Nitrofurantoin Rash 11/17/2015    Vitals: BP (!) 144/104 (BP Location: Right Arm, Patient Position: Sitting)   Pulse 92   Temp (!) 97.3 F (36.3 C) Comment: taken at front  Ht 5' 2" (1.575 m)   Wt 221 lb (100.2 kg)   BMI 40.42 kg/m  Last Weight:  Wt Readings from Last 1 Encounters:  07/15/19 221 lb (100.2 kg)   Last Height:   Ht Readings from Last 1 Encounters:  07/15/19 5' 2" (1.575 m)   Physical exam: Exam: Gen: NAD, conversant, generally tremuous and anxious, psychomotor slowing                    CV: RRR, no MRG. No Carotid Bruits. No peripheral edema, warm, nontender Eyes: Conjunctivae clear without exudates or hemorrhage  Neuro: Detailed Neurologic Exam  Speech:    Speech is normal; fluent and spontaneous with normal comprehension.  Cognition:    The patient is oriented to person, place, and time;     recent and remote memory intact;     language fluent;     normal attention, concentration,     fund of knowledge Cranial Nerves:    The pupils are equal, round, and reactive to light. The fundi flat. Visual fields are full to finger confrontation. Extraocular movements are intact. Trigeminal sensation is intact and the muscles of mastication are normal. The face is symmetric. The palate elevates in the midline. Hearing intact. Voice is normal. Shoulder shrug is normal. The tongue has normal motion without fasciculations.    Coordination:    Normal finger to nose and heel to shin. Normal rapid alternating movements.  Gait:    Heel-toe and tandem gait are normal.   Motor Observation:    No asymmetry, no atrophy, anxious and tremulous generally. Tone:    Normal muscle tone.    Posture:    Posture is normal. normal erect    Strength:    no focal deficit, very poor effort.      Sensation: intact to LT     Reflex Exam:  DTR's:    Deep tendon reflexes in the upper and lower extremities are symmetrical 2+ bilaterally.   Toes:    The toes are downgoing bilaterally.   Clonus:    Clonus is absent.        Assessment/Plan: 44 year old with significant anxiety (she is generally tremulous today and appears very anxious), depression, sleep apnea, chronic fatigue, migraine, HTN, paresthesias.  She was sent as a new request for numbness and tingling in the hands however she presents today for multiple non-localizing symptoms she has a list: fatigue, in a fog, blurry vision, eyes jerk, headaches, confusion, dizzy, body aches, She has jolts of electricity down the neck, squeezing chest pains, numbness and tingling hands and face and feet, joint pain, incontinence. We recommended MRI of the brain in the past and she could not have it completed, we will request again.   - MRI brain and cervical spine: Patient is complaining about multiple nonlocalizing symptoms as stated above, in the past we did recommend an MRI of the brain due to headaches, and now given her concerning symptoms and nonlocalizing quality I think that we should evaluate her with MRI of the brain and cervical spine with and without contrast for etiologies such as MS that can present in this way, she is describing what appears to be a Lhermitte sign from the neck with shocks when she moves it, I think it would be valuable to rule this out.  However she is extremely anxious, tremulous all over today, cannot rule out that the symptoms are not due to  extreme anxiety but still she needs a thorough evaluation. - Blood work today - EMG/NCS bilateral uppers and one lower  PRIOR A/P from when last seen 10/08/2018 : (She never followed up with me)  - Referral to OBGYN, she needs a referral. She has had tubal ligation. Discuss with obgyn about birth control for migraines.  - She had a sleep study, she had an in-home study and now going to do an in-house study. Encourage her to follow up with [facility](she isn't sure) and discussed sequelae of untreated sleep apnea including headaches, stroke and others. - MRI brain due to concerning symptoms of morning headaches, positional headaches,vision changes  to look for space occupying mass, chiari or intracranial hypertension (pseudotumor). - Preventative: Aimovig - Acute- Maxalt and 7m zofran at onset, repeat Maxalt in 2 hours - Email me with updates - follow up in the office in 3 months  Orders Placed This Encounter  Procedures  . MR BRAIN W WO CONTRAST  . MR CERVICAL SPINE W WO CONTRAST  . CBC  . Comprehensive metabolic panel  . TSH  . B12 and Folate Panel  . Methylmalonic acid, serum  . NCV with EMG(electromyography)    To prevent or relieve headaches, try the following: Cool Compress. Lie down and place a cool compress on your head.  Avoid headache triggers. If certain foods or odors seem to have triggered your migraines in the past, avoid them. A headache diary might help you identify triggers.  Include  physical activity in your daily routine. Try a daily walk or other moderate aerobic exercise.  Manage stress. Find healthy ways to cope with the stressors, such as delegating tasks on your to-do list.  Practice relaxation techniques. Try deep breathing, yoga, massage and visualization.  Eat regularly. Eating regularly scheduled meals and maintaining a healthy diet might help prevent headaches. Also, drink plenty of fluids.  Follow a regular sleep schedule. Sleep  deprivation might contribute to headaches Consider biofeedback. With this mind-body technique, you learn to control certain bodily functions -- such as muscle tension, heart rate and blood pressure -- to prevent headaches or reduce headache pain.    Proceed to emergency room if you experience new or worsening symptoms or symptoms do not resolve, if you have new neurologic symptoms or if headache is severe, or for any concerning symptom.   Provided education and documentation from American headache Society toolbox including articles on: chronic migraine medication overuse headache, chronic migraines, prevention of migraines, behavioral and other nonpharmacologic treatments for headache.   Orders Placed This Encounter  Procedures  . MR BRAIN W WO CONTRAST  . MR CERVICAL SPINE W WO CONTRAST  . CBC  . Comprehensive metabolic panel  . TSH  . B12 and Folate Panel  . Methylmalonic acid, serum  . NCV with EMG(electromyography)   No orders of the defined types were placed in this encounter.   Cc: CoxElnita Maxwell, MD, Irven Shelling, NP  Sarina Ill, MD  [facility] [address] [place], [place] [address]  Phone [phone] Fax [phone]

## 2019-07-22 LAB — COMPREHENSIVE METABOLIC PANEL
ALT: 45 IU/L — ABNORMAL HIGH (ref 0–32)
AST: 33 IU/L (ref 0–40)
Albumin/Globulin Ratio: 1.8 (ref 1.2–2.2)
Albumin: 4.3 g/dL (ref 3.8–4.8)
Alkaline Phosphatase: 84 IU/L (ref 39–117)
BUN/Creatinine Ratio: 11 (ref 9–23)
BUN: 8 mg/dL (ref 6–24)
Bilirubin Total: 0.3 mg/dL (ref 0.0–1.2)
CO2: 20 mmol/L (ref 20–29)
Calcium: 9.3 mg/dL (ref 8.7–10.2)
Chloride: 106 mmol/L (ref 96–106)
Creatinine, Ser: 0.7 mg/dL (ref 0.57–1.00)
GFR calc Af Amer: 122 mL/min/{1.73_m2} (ref >59)
GFR calc non Af Amer: 106 mL/min/{1.73_m2} (ref >59)
Globulin, Total: 2.4 g/dL (ref 1.5–4.5)
Glucose: 106 mg/dL — ABNORMAL HIGH (ref 65–99)
Potassium: 4.2 mmol/L (ref 3.5–5.2)
Sodium: 142 mmol/L (ref 134–144)
Total Protein: 6.7 g/dL (ref 6.0–8.5)

## 2019-07-22 LAB — CBC
Hematocrit: 41.9 % (ref 34.0–46.6)
Hemoglobin: 13.5 g/dL (ref 11.1–15.9)
MCH: 26.7 pg (ref 26.6–33.0)
MCHC: 32.2 g/dL (ref 31.5–35.7)
MCV: 83 fL (ref 79–97)
Platelets: 286 10*3/uL (ref 150–450)
RBC: 5.06 x10E6/uL (ref 3.77–5.28)
RDW: 14.5 % (ref 11.7–15.4)
WBC: 5.7 10*3/uL (ref 3.4–10.8)

## 2019-07-22 LAB — TSH: TSH: 4.43 u[IU]/mL (ref 0.450–4.500)

## 2019-07-22 LAB — METHYLMALONIC ACID, SERUM: Methylmalonic Acid: 179 nmol/L (ref 0–378)

## 2019-07-22 LAB — B12 AND FOLATE PANEL
Folate: 13.6 ng/mL (ref >3.0)
Vitamin B-12: 726 pg/mL (ref 232–1245)

## 2019-07-24 ENCOUNTER — Ambulatory Visit: Payer: BC Managed Care – PPO | Admitting: Physician Assistant

## 2019-08-12 ENCOUNTER — Ambulatory Visit (INDEPENDENT_AMBULATORY_CARE_PROVIDER_SITE_OTHER): Payer: BC Managed Care – PPO | Admitting: Psychiatry

## 2019-08-12 DIAGNOSIS — F411 Generalized anxiety disorder: Secondary | ICD-10-CM | POA: Diagnosis not present

## 2019-08-12 NOTE — Progress Notes (Signed)
[facility] Counselor/Therapist Progress Note  [name], MRN: [id number],    Date: 08/12/2019  Time Spent: 60 minutes   5:00pm to 6:00pm  Treatment Type: Individual Therapy  Reported Symptoms:  Anxiety, depression," fears of having to go to work and being tired"  Mental Status Exam:  Appearance:   n/a  telehealth     Behavior:  Sharing  Motor:  n/a  telehealth  Speech/Language:   Normal Rate  Affect:  anxious, depressed  Mood:  anxious and depressed  Thought process:  goal directed  Thought content:    WNL  Sensory/Perceptual disturbances:    WNL  Orientation:  oriented to person, place, time/date, situation, day of week, month of year and year  Attention:  Good  Concentration:  Good  Memory:  forgetful and I'm careful at work and write things down  [employer] of knowledge:   Good  Insight:    Good  Judgment:   Good  Impulse Control:  Good   Risk Assessment: Danger to Self:  No Self-injurious Behavior: No Danger to Others: No Duty to Warn:no Physical Aggression / Violence:No  Access to Firearms a concern: No  Gang Involvement:No   Subjective: Patient today reporting anxiety and depression.  Is concerned about "having to go to work and being tired."  I do sleep but am tired anyway.  "I'm not as bad as I was with the tiredness."  Using the CPAP machine.     Interventions: Solution-Oriented/Positive Psychology and Ego-Supportive  Diagnosis:   ICD-10-CM   1. Generalized anxiety disorder  F41.1      Plan: Patient not signing tx plan on computer screen due to [place].  Treatment Goals: Goals remain on tx plan as patient works on strategies to meet her goals. Progress is documented each session in "Progress" section of Plan.  Long term goal: Develop healthy cognitive patterns and beliefs about self and the world that lead to alleviation of depression and anxiety and help prevent relapse.  Short term goal: Verbally express understanding  of the relationship between depressed mood and repression of feelings- such as anger, hurt, and sadness.  Strategy: Patient will learn more about how repression of feelings such as anger, resentment, hurt, and sadness often result in depressed mood. She will also understand and accept that some sadness, anger, and hurt is a normal part of life and can be managed in healthy ways.  Progressing: Patient reporting "the biggest issue in my life are work concerns and I constantly think about what I can do to feel better and that would be not to have to work."  "I get anxious at work about making a mistake."  One thing patient has made clear in the past is she want to talk in session only to be heard, she does not want suggestions and she is very firm about that.  States again that "everybody tried to give me suggestions as to what may help and it just frustrates me.  Feeling unsupported at home by spouse but shares that "I keep trying to get him to help more."  Patient continues to vent her concerns in marriage, with her older daughter who is in court tomorrow, and with younger daughter and her [place] issues.  No easy interventions as she feels "she has tried everythiing."  States that it does help her to feel heard and accepted in session tonight "and that I will keep trying and doing what I can to better manage my anxiety."  Her meds help but she wishes there was med for anxiety that help a person feel more upbeat rather that more tired.  She is to mention that with her med provider when they speak.  Continue to deny any SI.  Even though the job environment is not one where she really wants to be "because of her tiredness", that is one place where she is around other people as she has few friends she can really trust. As session ends, patient acknowledges feeling some calmer and commits that she will try to follow recommended self-care including anxiety management skills and boundaries, even thought she feels she  has tried everything.   Goal review and progress/challenges noted with patient.  Next appt is within 3 weeks.   Shanon Ace, LCSW

## 2019-08-14 ENCOUNTER — Other Ambulatory Visit: Payer: Self-pay | Admitting: Physician Assistant

## 2019-08-15 ENCOUNTER — Encounter: Payer: BC Managed Care – PPO | Admitting: Neurology

## 2019-09-05 ENCOUNTER — Ambulatory Visit (INDEPENDENT_AMBULATORY_CARE_PROVIDER_SITE_OTHER): Payer: BC Managed Care – PPO | Admitting: Psychiatry

## 2019-09-05 DIAGNOSIS — F411 Generalized anxiety disorder: Secondary | ICD-10-CM | POA: Diagnosis not present

## 2019-09-05 NOTE — Progress Notes (Signed)
[facility] Counselor/Therapist Progress Note  [name], MRN: [id number],    Date: 09/05/2019  Time Spent: 60 minutes   10:00am to 11:00pm  Virtual Visit Note Connected with patient by a video enabled telemedicine/telehealth application or telephone, with their informed consent, and verified patient privacy and that I am speaking with the correct person using two identifiers. I discussed the limitations, risks, security and privacy concerns of performing psychotherapy and management service by telephone and the availability of in person appointments. I also discussed with the patient that there may be a patient responsible charge related to this service. The patient expressed understanding and agreed to proceed. I discussed the treatment planning with the patient. The patient was provided an opportunity to ask questions and all were answered. The patient agreed with the plan and demonstrated an understanding of the instructions. The patient was advised to call  our office if  symptoms worsen or feel they are in a crisis state and need immediate contact.   Therapist Location: [facility] Patient Location: home   Treatment Type: Individual Therapy  Reported Symptoms: anxiety, irritability, depression, overwhelmed with work/finances/home  Mental Status Exam:  Appearance:   n/a  telehealth     Behavior:  Appropriate and Sharing  Motor:  Normal  Speech/Language:   Normal Rate  Affect:  n/a  telehealth  Mood:  anxious, depressed and irritable  Thought process:  goal directed  Thought content:    WNL  Sensory/Perceptual disturbances:    WNL  Orientation:  oriented to person, place, time/date, situation, day of week, month of year and year  Attention:  Good/Fair  Concentration:  Good/Fair  Memory:  some forgetfulnss under stress  Fund of knowledge:   Good  Insight:    Good  Judgment:   Good  Impulse Control:  Good   Risk Assessment: Danger to  Self:  No Self-injurious Behavior: No Danger to Others: No Duty to Warn:no Physical Aggression / Violence:No  Access to Firearms a concern: No  Gang Involvement:No   Subjective: Patient today reports anxiety, depression, irritability, overwhelmed with work/finances/home and with a daughter with special needs.  Reports difficulty getting services that she feels will help daughter and doesn't want others making suggestions to her .  Not happy at work and wishes she didn't have to work.  States "I've never like to work. Have had anxiety and I've never liked any job I've had." Reports husband works but not very responsible with money, and typically doesn't hold a job more than a couple years.    Interventions: Solution-Oriented/Positive Psychology and Ego-Supportive  Diagnosis:   ICD-10-CM   1. Generalized anxiety disorder  F41.1      Plan: Patient not signing tx plan on computer screen due to [place].  Treatment Goals: Goals remain on tx plan as patient works on strategies to meet her goals. Progress is documented each session in "Progress" section of Plan.  Long term goal: Develop healthy cognitive patterns and beliefs about self and the world that lead to alleviation of depression and anxiety and help prevent relapse.  Short term goal: Verbally express understanding of the relationship between depressed mood and repression of feelings- such as anger, hurt, and sadness.  Strategy: Patient will learn more about how repression of feelings such as anger, resentment, hurt, and sadness often result in depressed mood. She will also understand and accept that some sadness, anger, and hurt is a normal part of life and can be managed in  healthy ways.  Progressing: Today patient worked on very open expression of her frustrations, anxiety, and depression, adding that anxiety is her stronger symptom right now. Shared lots of concern about her work, Psychiatrist, and finances. Stated  several times that she doesn't want to work but has to due to husband's not usually keeping a job very long and his lack of good Comptroller.  Has struggled with anxiety and states that "everybody has suggestions for her and nothing works, and I don't want suggestions from people, I just don't want to work."  Felt she most needed to vent today which she did for quite a while. Did offer her understanding as well as inviting her to  Say what she felt might help her most.  Stated that it is helpful when we talk in these sessions.  Asked her about the times that are her better times, and what does she notice is different at those times that may be contributing to her feeling better.  She had a tough time answering this and is to think about it more, and will follow up next appt.  Reviewed what our goals in therapy are and invited her again to have more input.  She is encouraged to focus on good self-care (mental and physical) to include giving herself some breaks and more positive self-talk as I shared some examples with her.  Goal review and some progress/challenges noted with patient.  Next appt within 3-4 weeks.   Shanon Ace, LCSW

## 2019-09-24 ENCOUNTER — Other Ambulatory Visit: Payer: Self-pay | Admitting: Physician Assistant

## 2019-09-24 NOTE — Telephone Encounter (Signed)
Last apt 02/15/2019, still nothing scheduled

## 2019-10-02 ENCOUNTER — Encounter: Payer: Self-pay | Admitting: Physician Assistant

## 2019-10-02 ENCOUNTER — Ambulatory Visit (INDEPENDENT_AMBULATORY_CARE_PROVIDER_SITE_OTHER): Payer: BC Managed Care – PPO | Admitting: Physician Assistant

## 2019-10-02 DIAGNOSIS — F331 Major depressive disorder, recurrent, moderate: Secondary | ICD-10-CM | POA: Diagnosis not present

## 2019-10-02 DIAGNOSIS — G4700 Insomnia, unspecified: Secondary | ICD-10-CM | POA: Diagnosis not present

## 2019-10-02 DIAGNOSIS — F411 Generalized anxiety disorder: Secondary | ICD-10-CM | POA: Diagnosis not present

## 2019-10-02 MED ORDER — FLUOXETINE HCL 40 MG PO CAPS: 80.0000 mg | ORAL_CAPSULE | Freq: Every day | ORAL | 1 refills | Status: DC

## 2019-10-02 MED ORDER — HYDROXYZINE HCL 10 MG PO TABS: 10.0000 mg | ORAL_TABLET | Freq: Three times a day (TID) | ORAL | 1 refills | Status: DC | PRN

## 2019-10-02 NOTE — Progress Notes (Signed)
[facility]  [name],  MRN: [id number]  PCP: Rochel Brome, MD  Date of Evaluation: 10/02/2019 Time spent:20 minutes  Chief Complaint:  Chief Complaint    Anxiety; Depression; Follow-up     Virtual Visit via Telephone Note  I connected with patient by a video enabled telemedicine application or telephone, with their informed consent, and verified patient privacy and that I am speaking with the correct person using two identifiers.  I am private, in my office and the patient is home.   I discussed the limitations, risks, security and privacy concerns of performing an evaluation and management service by telephone and the availability of in person appointments. I also discussed with the patient that there may be a patient responsible charge related to this service. The patient expressed understanding and agreed to proceed.   I discussed the assessment and treatment plan with the patient. The patient was provided an opportunity to ask questions and all were answered. The patient agreed with the plan and demonstrated an understanding of the instructions.   The patient was advised to call back or seek an in-person evaluation if the symptoms worsen or if the condition fails to improve as anticipated.  I provided 20 minutes of non-face-to-face time during this encounter.  HISTORY/CURRENT STATUS: HPI For routine med check.  States she's overwhelmed with everything. Feels like she's existing. Had a couple of months since the last visit in Sept., when she was more productive and felt better.  But now she's back to her baseline where she feels sad, more anxious, very tired during the day, not really enjoying things.  Energy and motivation are low.  She is able to get out of bed now and do things but several months back she even had trouble with that.  Not missing work though.  Not isolating.  Sleeping pretty well right now.  Denies suicidal or homicidal  thoughts.  Has anxiety, more generalized than panic attacks although they do occur.  The Klonopin does help but it makes her sleepy so she does not take it often especially when she is having to work.  Being at work around a lot of people is a trigger for the anxiety.  When there is a lot of noise, she gets more triggered also.  Migraines have not been bad.  Has not had a severe one in a long time.  The Aimovig has helped prevent them.  Patient denies increased energy with decreased need for sleep, no increased talkativeness, no racing thoughts, no impulsivity or risky behaviors, no increased spending, no increased libido, no grandiosity, no increased irritability or anger, and no hallucinations.  Denies dizziness, syncope, seizures, numbness, tingling, tremor, tics, unsteady gait, slurred speech, confusion. Denies muscle or joint pain, stiffness, or dystonia.  Individual Medical History/ Review of Systems: Changes? :No    Past medications for mental health diagnoses include: Lamictal, Abilify, Prozac, Zoloft, Celexa, Lexapro, Paxil, Cymbalta, Wellbutrin, lithium, Depakote, Seroquel, Risperdal, Zyprexa, gabapentin, Klonopin, Xanax, trazodone, BuSpar, Luvox, Ativan  Allergies: Oxycodone-acetaminophen and Nitrofurantoin  Current Medications:  Current Outpatient Medications:  .  clonazePAM (KLONOPIN) 0.5 MG tablet, Take 0.5-1 tablets (0.25-0.5 mg total) by mouth 2 (two) times daily as needed for anxiety. (Patient taking differently: Take 1 mg by mouth as needed for anxiety. ), Disp: 60 tablet, Rfl: 0 .  Erenumab-aooe (AIMOVIG) 140 MG/ML SOAJ, Inject 140 mg into the skin every 30 (thirty) days., Disp: 1 pen, Rfl: 11 .  Iron, Ferrous Gluconate, 256 (28 Fe)  MG TABS, Take by mouth., Disp: , Rfl:  .  modafinil (PROVIGIL) 200 MG tablet, Take 0.5 tablets (100 mg total) by mouth daily as needed., Disp: 30 tablet, Rfl: 0 .  propranolol ER (INDERAL LA) 80 MG 24 hr capsule, TAKE ONE CAPSULE BY MOUTH AT  BEDTIME (Patient taking differently: Take 80 mg by mouth 2 (two) times daily. ), Disp: 180 capsule, Rfl: 6 .  rizatriptan (MAXALT-MLT) 10 MG disintegrating tablet, Take 1 tablet (10 mg total) by mouth as needed for migraine. May repeat in 2 hours if needed, Disp: 9 tablet, Rfl: 11 .  Vitamin D, Ergocalciferol, (DRISDOL) 1.25 MG (50000 UNIT) CAPS capsule, Take 50,000 Units by mouth every 7 (seven) days., Disp: , Rfl:  .  FLUoxetine (PROZAC) 40 MG capsule, Take 2 capsules (80 mg total) by mouth daily., Disp: 60 capsule, Rfl: 1 .  hydrOXYzine (ATARAX/VISTARIL) 10 MG tablet, Take 1-3 tablets (10-30 mg total) by mouth every 8 (eight) hours as needed., Disp: 90 tablet, Rfl: 1 Medication Side Effects: none  Family Medical/ Social History: Changes? No  MENTAL HEALTH EXAM:  There were no vitals taken for this visit.There is no height or weight on file to calculate BMI.  General Appearance: Unable to assess  Eye Contact:  Unable to assess  Speech:  Clear and Coherent and Normal Rate  Volume:  Normal  Mood:  Euthymic  Affect:  Unable to assess  Thought Process:  Goal Directed and Descriptions of Associations: Intact  Orientation:  Full (Time, Place, and Person)  Thought Content: Logical   Suicidal Thoughts:  No  Homicidal Thoughts:  No  Memory:  WNL  Judgement:  Good  Insight:  Good  Psychomotor Activity:  Unable to assess  Concentration:  Concentration: Good  Recall:  Good  Fund of Knowledge: Good  Language: Good  Assets:  Desire for Improvement  ADL's:  Intact  Cognition: WNL  Prognosis:  Good    DIAGNOSES:    ICD-10-CM   1. Major depressive disorder, recurrent episode, moderate (HCC)  F33.1   2. Generalized anxiety disorder  F41.1   3. Insomnia, unspecified type  G47.00     Receiving Psychotherapy: Yes Rinaldo Cloud, LCSW   RECOMMENDATIONS:  PDMP was reviewed. I spent 20 minutes with her. Increase Prozac to 80 mg daily.  This will help with both anxiety and  depression. Continue Klonopin 0.5 mg, 1/2-1 p.o. twice daily as needed. Continue hydroxyzine but change dose to 10 mg, 1-3 p.o. every 8 hours as needed.  This will give her more flexibility to dose than the 25 mg did. Continue modafinil 200 mg, one half p.o. every morning as needed. Continue therapy with Rinaldo Cloud, LCSW. Return in 4 to 6 weeks.  Donnal Moat, PA-C

## 2019-10-25 ENCOUNTER — Other Ambulatory Visit: Payer: Self-pay | Admitting: Neurology

## 2019-10-25 DIAGNOSIS — G43709 Chronic migraine without aura, not intractable, without status migrainosus: Secondary | ICD-10-CM

## 2019-11-28 ENCOUNTER — Other Ambulatory Visit: Payer: Self-pay | Admitting: Physician Assistant

## 2019-11-29 ENCOUNTER — Other Ambulatory Visit: Payer: Self-pay

## 2019-12-18 NOTE — Telephone Encounter (Signed)
Completed renewal Aimovig PA on Cover My Meds. Key: [id number]. Awaiting determination from [place].

## 2019-12-23 NOTE — Telephone Encounter (Signed)
Received fax from [place] [employer] [place]. Aimovig 140 mg approved 12/18/19-12/16/20. I faxed notification to pharmacy. Received a receipt of confirmation.

## 2020-01-24 ENCOUNTER — Other Ambulatory Visit: Payer: Self-pay | Admitting: Physician Assistant

## 2020-01-26 NOTE — Telephone Encounter (Signed)
Last apt 04/21 was due back 4-6 weeks

## 2020-01-29 ENCOUNTER — Other Ambulatory Visit: Payer: Self-pay

## 2020-01-29 MED ORDER — FLUOXETINE HCL 40 MG PO CAPS: 80.0000 mg | ORAL_CAPSULE | Freq: Every day | ORAL | 0 refills | Status: DC

## 2020-02-02 ENCOUNTER — Encounter (HOSPITAL_COMMUNITY): Payer: Self-pay

## 2020-02-02 ENCOUNTER — Emergency Department (HOSPITAL_COMMUNITY)
Admission: EM | Admit: 2020-02-02 | Discharge: 2020-02-02 | Disposition: A | Discharge status: Home / Self Care | Payer: BC Managed Care – PPO | Attending: Emergency Medicine | Admitting: Emergency Medicine

## 2020-02-02 ENCOUNTER — Other Ambulatory Visit: Payer: Self-pay

## 2020-02-02 DIAGNOSIS — Z79899 Other long term (current) drug therapy: Secondary | ICD-10-CM | POA: Insufficient documentation

## 2020-02-02 DIAGNOSIS — N939 Abnormal uterine and vaginal bleeding, unspecified: Secondary | ICD-10-CM | POA: Diagnosis present

## 2020-02-02 DIAGNOSIS — I10 Essential (primary) hypertension: Secondary | ICD-10-CM | POA: Insufficient documentation

## 2020-02-02 DIAGNOSIS — Z87891 Personal history of nicotine dependence: Secondary | ICD-10-CM | POA: Diagnosis not present

## 2020-02-02 LAB — CBC WITH DIFFERENTIAL/PLATELET
Abs Immature Granulocytes: 0.04 10*3/uL (ref 0.00–0.07)
Basophils Absolute: 0.1 10*3/uL (ref 0.0–0.1)
Basophils Relative: 2 %
Eosinophils Absolute: 0.4 10*3/uL (ref 0.0–0.5)
Eosinophils Relative: 6 %
HCT: 31.1 % — ABNORMAL LOW (ref 36.0–46.0)
Hemoglobin: 9.4 g/dL — ABNORMAL LOW (ref 12.0–15.0)
Immature Granulocytes: 1 %
Lymphocytes Relative: 29 %
Lymphs Abs: 1.8 10*3/uL (ref 0.7–4.0)
MCH: 23.1 pg — ABNORMAL LOW (ref 26.0–34.0)
MCHC: 30.2 g/dL (ref 30.0–36.0)
MCV: 76.4 fL — ABNORMAL LOW (ref 80.0–100.0)
Monocytes Absolute: 0.5 10*3/uL (ref 0.1–1.0)
Monocytes Relative: 8 %
Neutro Abs: 3.6 10*3/uL (ref 1.7–7.7)
Neutrophils Relative %: 54 %
Platelets: 351 10*3/uL (ref 150–400)
RBC: 4.07 MIL/uL (ref 3.87–5.11)
RDW: 16.8 % — ABNORMAL HIGH (ref 11.5–15.5)
WBC: 6.5 10*3/uL (ref 4.0–10.5)
nRBC: 0 % (ref 0.0–0.2)

## 2020-02-02 LAB — BASIC METABOLIC PANEL
Anion gap: 9 (ref 5–15)
BUN: 14 mg/dL (ref 6–20)
CO2: 23 mmol/L (ref 22–32)
Calcium: 9.2 mg/dL (ref 8.9–10.3)
Chloride: 108 mmol/L (ref 98–111)
Creatinine, Ser: 0.68 mg/dL (ref 0.44–1.00)
GFR calc Af Amer: >60 mL/min (ref >60)
GFR calc non Af Amer: >60 mL/min (ref >60)
Glucose, Bld: 121 mg/dL — ABNORMAL HIGH (ref 70–99)
Potassium: 3.7 mmol/L (ref 3.5–5.1)
Sodium: 140 mmol/L (ref 135–145)

## 2020-02-02 LAB — TYPE AND SCREEN
ABO/RH(D): O POS
Antibody Screen: NEGATIVE

## 2020-02-02 LAB — PROTIME-INR
INR: 1 (ref 0.8–1.2)
Prothrombin Time: 13.1 seconds (ref 11.4–15.2)

## 2020-02-02 LAB — HCG, QUANTITATIVE, PREGNANCY: hCG, Beta Chain, Quant, S: <1 m[IU]/mL (ref <5)

## 2020-02-02 LAB — HEMOGLOBIN AND HEMATOCRIT, BLOOD
HCT: 33 % — ABNORMAL LOW (ref 36.0–46.0)
Hemoglobin: 9.8 g/dL — ABNORMAL LOW (ref 12.0–15.0)

## 2020-02-02 MED ORDER — MEDROXYPROGESTERONE ACETATE 10 MG PO TABS
10.0000 mg | ORAL_TABLET | Freq: Every day | ORAL | Status: DC
  Filled 2020-02-02: qty 1

## 2020-02-02 MED ORDER — MEDROXYPROGESTERONE ACETATE 10 MG PO TABS: 10.0000 mg | ORAL_TABLET | Freq: Every day | ORAL | 0 refills | Status: AC

## 2020-02-02 NOTE — ED Provider Notes (Signed)
[facility] DEPT Provider Note   CSN: [id number] Arrival date & time: 02/02/20  0117     History Chief Complaint  Patient presents with  . Vaginal Bleeding    Amanda Marks is a 44 y.o. female.  Patient is a 44 year old female with past medical history of migraines, kidney stones, and dysfunctional uterine bleeding.  She presents today for evaluation of vaginal bleeding.  This started earlier today.  She reports this evening passing several clots.  She does describe some suprapubic cramping, but denies any fevers or chills.  Patient has had a tubal ligation in the past and denies the possibility of pregnancy.  Patient had a similar episode nearly 2 years ago.  She was treated with hormonal therapy and the bleeding resolved.  The history is provided by the patient.       Past Medical History:  Diagnosis Date  . Anemia, unspecified   . Anxiety disorder, unspecified   . Chronic fatigue, unspecified   . Depression   . Essential (primary) hypertension   . Kidney stones   . Migraines   . Sleep apnea, unspecified   . Vitamin D deficiency, unspecified     Patient Active Problem List   Diagnosis Date Noted  . Chronic migraine without aura without status migrainosus, not intractable 10/08/2018  . Major depression, recurrent, chronic ([place]) 04/03/2018  . Major depressive disorder, single episode, severe ([place]) 04/02/2018    Past Surgical History:  Procedure Laterality Date  . CHOLECYSTECTOMY    . STENT PLACE LEFT URETER ([place])    . TUBAL LIGATION    . UMBILICAL HERNIA REPAIR     2015 & 2017     OB History   No obstetric history on file.     Family History  Problem Relation Age of Onset  . Hypertension Mother   . Hypertension Father   . Diabetes Father   . Migraines Neg Hx     Social History   Tobacco Use  . Smoking status: Former Smoker    Types: E-cigarettes  . Smokeless tobacco: Never Used  Vaping Use  . Vaping Use:  Former  Substance Use Topics  . Alcohol use: No  . Drug use: Never    Home Medications Prior to Admission medications   Medication Sig Start Date End Date Taking? Authorizing Provider  AIMOVIG 140 MG/ML SOAJ Inject 140 mg into the skin every 30 (thirty) days. 10/27/19   Melvenia Beam, MD  clonazePAM (KLONOPIN) 0.5 MG tablet Take 0.5-1 tablets (0.25-0.5 mg total) by mouth 2 (two) times daily as needed for anxiety. Patient taking differently: Take 1 mg by mouth as needed for anxiety.  02/11/19   Addison Lank, PA-C  FLUoxetine (PROZAC) 40 MG capsule Take 2 capsules (80 mg total) by mouth daily. 01/29/20   Donnal Moat T, PA-C  hydrOXYzine (ATARAX/VISTARIL) 10 MG tablet Take 1-3 tablets (10-30 mg total) by mouth every 8 (eight) hours as needed. 10/02/19   Donnal Moat T, PA-C  Iron, Ferrous Gluconate, 256 (28 Fe) MG TABS Take by mouth.    [provider]  modafinil (PROVIGIL) 200 MG tablet Take 0.5 tablets (100 mg total) by mouth daily as needed. 02/11/19   Donnal Moat T, PA-C  propranolol ER (INDERAL LA) 80 MG 24 hr capsule TAKE ONE CAPSULE BY MOUTH AT BEDTIME Patient taking differently: Take 80 mg by mouth 2 (two) times daily.  10/17/18   Melvenia Beam, MD  rizatriptan (MAXALT-MLT) 10 MG disintegrating  tablet Take 1 tablet (10 mg total) by mouth as needed for migraine. May repeat in 2 hours if needed 10/08/18   Melvenia Beam, MD  Vitamin D, Ergocalciferol, (DRISDOL) 1.25 MG (50000 UNIT) CAPS capsule Take 50,000 Units by mouth every 7 (seven) days.    [provider]    Allergies    Oxycodone-acetaminophen and Nitrofurantoin  Review of Systems   Review of Systems  All other systems reviewed and are negative.   Physical Exam Updated Vital Signs BP (!) 170/105 (BP Location: Right Arm)   Pulse (!) 107   Temp (!) 97.2 F (36.2 C) (Oral)   Resp 16   LMP 02/02/2020   SpO2 98%   Physical Exam Vitals and nursing note reviewed.  Constitutional:      General:  She is not in acute distress.    Appearance: She is well-developed. She is not diaphoretic.  HENT:     Head: Normocephalic and atraumatic.  Cardiovascular:     Rate and Rhythm: Normal rate and regular rhythm.     Heart sounds: No murmur heard.  No friction rub. No gallop.   Pulmonary:     Effort: Pulmonary effort is normal. No respiratory distress.     Breath sounds: Normal breath sounds. No wheezing.  Abdominal:     General: Bowel sounds are normal. There is no distension.     Palpations: Abdomen is soft.     Tenderness: There is no abdominal tenderness.  Musculoskeletal:        General: Normal range of motion.     Cervical back: Normal range of motion and neck supple.  Skin:    General: Skin is warm and dry.  Neurological:     Mental Status: She is alert and oriented to person, place, and time.     ED Results / Procedures / Treatments   Labs (all labs ordered are listed, but only abnormal results are displayed) Labs Reviewed  BASIC METABOLIC PANEL  CBC WITH DIFFERENTIAL/PLATELET  PROTIME-INR  I-STAT BETA HCG BLOOD, ED ([facility], [facility], [facility] ONLY)  TYPE AND SCREEN    EKG None  Radiology No results found.  Procedures Procedures (including critical care time)  Medications Ordered in ED Medications - No data to display  ED Course  I have reviewed the triage vital signs and the nursing notes.  Pertinent labs & imaging results that were available during my care of the patient were reviewed by me and considered in my medical decision making (see chart for details).    MDM Rules/Calculators/A&P  Patient presenting here with complaints of vaginal bleeding. This began earlier today. She describes a rush of blood and clots. She denies any lightheadedness or dizziness.  She has had this happen in the past and has bled to the point where she required a blood transfusion. She has seen a GYN doctor in the past for this who prescribed Provera which seem to help.  Patient's initial  hemoglobin is 9.4. It was repeated 2 hours later and was 9.8. Her pregnancy test is negative. I am uncertain as to the etiology of her bleeding, but suspect fibroids. I feel as though an ultrasound is indicated, however we do not have ultrasound coverage here at [facility]. Patient tells me she has a GYN doctor she can follow-up within the next 2 days. She has had very little additional bleeding here in the ER and with her stable hemoglobin and vital signs, I feel as though discharge is appropriate.  She will be prescribed Provera and is to follow-up with her GYN in the next 2 days. To return here if she worsens.  Final Clinical Impression(s) / ED Diagnoses Final diagnoses:  None    Rx / DC Orders ED Discharge Orders    None       Veryl Speak, MD 02/02/20 [phone]

## 2020-02-02 NOTE — Discharge Instructions (Addendum)
Begin taking Provera as prescribed.  Follow-up with your GYN in the next 2 to 3 days, and return to the ER if you develop worsening bleeding, dizziness, lightheadedness, difficulty breathing, or other new and concerning symptoms.

## 2020-02-02 NOTE — ED Triage Notes (Signed)
Arrived POV from home. Patient reports heavy vaginal bleeding that started today. Patient states this happened to her a couple of years ago and she had to get a blood transfusion.

## 2020-02-26 ENCOUNTER — Other Ambulatory Visit: Payer: Self-pay | Admitting: Physician Assistant

## 2020-02-26 NOTE — Telephone Encounter (Signed)
Review.

## 2020-03-16 ENCOUNTER — Ambulatory Visit: Payer: BC Managed Care – PPO | Admitting: Physician Assistant

## 2020-03-25 ENCOUNTER — Other Ambulatory Visit: Payer: Self-pay | Admitting: Physician Assistant

## 2020-03-25 NOTE — Telephone Encounter (Signed)
Please call patient to make an appt.

## 2020-03-25 NOTE — Telephone Encounter (Signed)
Please review

## 2020-03-31 NOTE — Telephone Encounter (Signed)
LM to call to schedule follow up.  Missed appt. 10/4 due to illness and it needs to be rescheduled for medication refills

## 2020-04-23 ENCOUNTER — Other Ambulatory Visit: Payer: Self-pay | Admitting: Physician Assistant

## 2020-05-22 ENCOUNTER — Ambulatory Visit: Payer: BC Managed Care – PPO | Admitting: Physician Assistant

## 2020-05-23 ENCOUNTER — Other Ambulatory Visit: Payer: Self-pay | Admitting: Physician Assistant

## 2020-05-25 ENCOUNTER — Other Ambulatory Visit: Payer: Self-pay

## 2020-05-25 ENCOUNTER — Ambulatory Visit (INDEPENDENT_AMBULATORY_CARE_PROVIDER_SITE_OTHER): Payer: BC Managed Care – PPO | Admitting: Physician Assistant

## 2020-05-25 ENCOUNTER — Encounter: Payer: Self-pay | Admitting: Physician Assistant

## 2020-05-25 DIAGNOSIS — R5383 Other fatigue: Secondary | ICD-10-CM | POA: Diagnosis not present

## 2020-05-25 DIAGNOSIS — G4700 Insomnia, unspecified: Secondary | ICD-10-CM

## 2020-05-25 DIAGNOSIS — F331 Major depressive disorder, recurrent, moderate: Secondary | ICD-10-CM | POA: Diagnosis not present

## 2020-05-25 DIAGNOSIS — F411 Generalized anxiety disorder: Secondary | ICD-10-CM | POA: Diagnosis not present

## 2020-05-25 DIAGNOSIS — G43709 Chronic migraine without aura, not intractable, without status migrainosus: Secondary | ICD-10-CM

## 2020-05-25 MED ORDER — MODAFINIL 200 MG PO TABS: 100.0000 mg | ORAL_TABLET | Freq: Every day | ORAL | 0 refills | Status: DC | PRN

## 2020-05-25 MED ORDER — VITAMIN D3 50 MCG (2000 UT) PO CAPS: 2000.0000 [IU] | ORAL_CAPSULE | Freq: Every day | ORAL | 11 refills | Status: AC

## 2020-05-25 MED ORDER — CLONAZEPAM 0.5 MG PO TABS: 0.2500 mg | ORAL_TABLET | Freq: Two times a day (BID) | ORAL | 1 refills | Status: DC | PRN

## 2020-05-25 MED ORDER — GABAPENTIN 300 MG PO CAPS: ORAL_CAPSULE | ORAL | 1 refills | Status: DC

## 2020-05-25 MED ORDER — B COMPLEX VITAMINS PO CAPS: 1.0000 | ORAL_CAPSULE | Freq: Every day | ORAL | 11 refills | Status: AC

## 2020-05-25 MED ORDER — OMEGA-3 + D 500-200 MG-UNIT PO CAPS: 1.0000 | ORAL_CAPSULE | Freq: Every day | ORAL | 11 refills | Status: AC

## 2020-05-25 MED ORDER — MULTIVITAMIN GUMMIES ADULT PO CHEW: 1.0000 | CHEWABLE_TABLET | Freq: Every day | ORAL | 11 refills | Status: AC

## 2020-05-25 MED ORDER — FLUOXETINE HCL 40 MG PO CAPS: 80.0000 mg | ORAL_CAPSULE | Freq: Every day | ORAL | 1 refills | Status: DC

## 2020-05-25 NOTE — Progress Notes (Signed)
[facility]  [name],  MRN: [id number]  PCP: Rochel Brome, MD  Date of Evaluation: 05/25/2020 Time spent:40 minutes  Chief Complaint:  Chief Complaint    Anxiety; Depression      HISTORY/CURRENT STATUS: HPI For routine med check.  6 months overdue for follow-up.  States she's overwhelmed with everything. "It's too much to get up and go to work, it's too much to manage everybody, I feel like there's somebody with a whip behind me making me do everything, and I can't even take care of myself."  Doesn't enjoy much of anything. Energy and motivation are low. Doesn't take the Modafinil daily, but it does help when needed. It does make her a little anxous right after she takes it. Cries easily, sleeps ok with the CPAP and is followed by sleep specialist. Never feels rested though. No SI/HI.   Has anxiety, gets CP daily, happens more when she goes to work. Also gets diarrhea and dizziness before work. Has seen Cardiology and Neuro since LOV and heart is ok. Not having migraines much at all now.   Patient denies increased energy with decreased need for sleep, no increased talkativeness, no racing thoughts, no impulsivity or risky behaviors, no increased spending, no increased libido, no grandiosity, no increased irritability or anger, and no hallucinations.  Denies dizziness, syncope, seizures, numbness, tingling, tremor, tics, unsteady gait, slurred speech, confusion. Denies muscle or joint pain, stiffness, or dystonia.  Individual Medical History/ Review of Systems: Changes? :Yes  She saw a cardiologist a few months ago for chest pain.  EKG was normal and no further cardiac testing was deemed necessary.  Is now on propranolol for migraine prevention.  Past medications for mental health diagnoses include: Lamictal, Abilify, Prozac, Zoloft, Celexa, Lexapro, Paxil, Cymbalta, Wellbutrin, lithium, Depakote, Seroquel, Risperdal, Zyprexa, gabapentin, Klonopin,  Xanax, trazodone, BuSpar, Luvox, Ativan  Allergies: Oxycodone-acetaminophen and Nitrofurantoin  Current Medications:  Current Outpatient Medications:  .  AIMOVIG 140 MG/ML SOAJ, Inject 140 mg into the skin every 30 (thirty) days., Disp: 1 mL, Rfl: 11 .  hydrOXYzine (ATARAX/VISTARIL) 10 MG tablet, Take 1-3 tablets (10-30 mg total) by mouth every 8 (eight) hours as needed., Disp: 90 tablet, Rfl: 1 .  Iron, Ferrous Gluconate, 256 (28 Fe) MG TABS, Take by mouth., Disp: , Rfl:  .  propranolol ER (INDERAL LA) 80 MG 24 hr capsule, TAKE ONE CAPSULE BY MOUTH AT BEDTIME (Patient taking differently: Take 80 mg by mouth daily.), Disp: 180 capsule, Rfl: 6 .  rizatriptan (MAXALT-MLT) 10 MG disintegrating tablet, Take 1 tablet (10 mg total) by mouth as needed for migraine. May repeat in 2 hours if needed, Disp: 9 tablet, Rfl: 11 .  UNABLE TO FIND, CPAP, Disp: , Rfl:  .  Vitamin D, Ergocalciferol, (DRISDOL) 1.25 MG (50000 UNIT) CAPS capsule, Take 50,000 Units by mouth every 7 (seven) days., Disp: , Rfl:  .  b complex vitamins capsule, Take 1 capsule by mouth daily., Disp: 30 capsule, Rfl: 11 .  Cholecalciferol (VITAMIN D3) 50 MCG (2000 UT) capsule, Take 1 capsule (2,000 Units total) by mouth daily., Disp: 30 capsule, Rfl: 11 .  clonazePAM (KLONOPIN) 0.5 MG tablet, Take 0.5-1 tablets (0.25-0.5 mg total) by mouth 2 (two) times daily as needed for anxiety., Disp: 60 tablet, Rfl: 1 .  Fish Oil-Cholecalciferol (OMEGA-3 + D) 500-200 MG-UNIT CAPS, Take 1 capsule by mouth daily., Disp: 30 capsule, Rfl: 11 .  FLUoxetine (PROZAC) 40 MG capsule, Take 2 capsules (80 mg total) by  mouth daily., Disp: 60 capsule, Rfl: 1 .  gabapentin (NEURONTIN) 300 MG capsule, 1 po q am, and 2 po q evening 3-4 hours before bed., Disp: 90 capsule, Rfl: 1 .  medroxyPROGESTERone (PROVERA) 10 MG tablet, Take 1 tablet (10 mg total) by mouth daily. (Patient not taking: Reported on 05/25/2020), Disp: 10 tablet, Rfl: 0 .  modafinil (PROVIGIL) 200 MG  tablet, Take 0.5 tablets (100 mg total) by mouth daily as needed., Disp: 30 tablet, Rfl: 0 .  Multiple Vitamins-Minerals (MULTIVITAMIN GUMMIES ADULT) CHEW, Chew 1 tablet by mouth daily., Disp: 30 tablet, Rfl: 11 Medication Side Effects: none  Family Medical/ Social History: Changes? No  MENTAL HEALTH EXAM:  There were no vitals taken for this visit.There is no height or weight on file to calculate BMI.  General Appearance: Casual, Neat, Well Groomed and Obese  Eye Contact:  Good  Speech:  Clear and Coherent and Normal Rate  Volume:  Normal  Mood:  Euthymic  Affect:  Appropriate  Thought Process:  Goal Directed and Descriptions of Associations: Intact  Orientation:  Full (Time, Place, and Person)  Thought Content: Logical   Suicidal Thoughts:  No  Homicidal Thoughts:  No  Memory:  WNL  Judgement:  Good  Insight:  Good  Psychomotor Activity:  Normal  Concentration:  Concentration: Good  Recall:  Good  Fund of Knowledge: Good  Language: Good  Assets:  Desire for Improvement  ADL's:  Intact  Cognition: WNL  Prognosis:  Good    DIAGNOSES:    ICD-10-CM   1. Major depressive disorder, recurrent episode, moderate (HCC)  F33.1   2. Generalized anxiety disorder  F41.1   3. Insomnia, unspecified type  G47.00   4. Fatigue, unspecified type  R53.83   5. Chronic migraine without aura without status migrainosus, not intractable  G43.709     Receiving Psychotherapy: No  Saw Rinaldo Cloud, LCSW in the past.   RECOMMENDATIONS:  PDMP was reviewed. I provided 40 minutes of face-to-face time during this encounter.  During that time, I reviewed cardiology records and labs drawn 02/02/2020.  See on chart. The anxiety seems worse but overall the depression seems stable.  They can be very intertwined however.  Encouraged her to take the Klonopin more for rescue anxiety.  The propranolol as well as gabapentin can help with anxiety as well.  Start gabapentin 300 mg, 2 p.o. q. evening  approximately 3 to 4 hours before bedtime.  1 p.o. every morning if tolerated. Continue propranolol LA 80 mg p.o. daily.  Per neurology. Continue Prozac 80 mg daily.   Continue Klonopin 0.5 mg, 1/2-1 p.o. twice daily as needed. Continue hydroxyzine but change dose to 10 mg, 1-3 p.o. every 8 hours as needed.  This will give her more flexibility to dose than the 25 mg did. Continue modafinil 200 mg, one half p.o. every morning as needed, for energy and motivation. Restart therapy with Rinaldo Cloud, LCSW. Return in 4 to 6 weeks.  Donnal Moat, PA-C

## 2020-06-24 ENCOUNTER — Other Ambulatory Visit: Payer: Self-pay | Admitting: Physician Assistant

## 2020-06-24 NOTE — Telephone Encounter (Signed)
Not sure she needs if taking 1/2 tab daily?

## 2020-07-21 ENCOUNTER — Other Ambulatory Visit: Payer: Self-pay | Admitting: Physician Assistant

## 2020-09-17 ENCOUNTER — Ambulatory Visit: Payer: BC Managed Care – PPO | Admitting: Neurology

## 2020-10-05 ENCOUNTER — Ambulatory Visit (INDEPENDENT_AMBULATORY_CARE_PROVIDER_SITE_OTHER): Payer: BC Managed Care – PPO | Admitting: Physician Assistant

## 2020-10-05 ENCOUNTER — Encounter: Payer: Self-pay | Admitting: Physician Assistant

## 2020-10-05 ENCOUNTER — Other Ambulatory Visit: Payer: Self-pay

## 2020-10-05 DIAGNOSIS — F3341 Major depressive disorder, recurrent, in partial remission: Secondary | ICD-10-CM | POA: Diagnosis not present

## 2020-10-05 DIAGNOSIS — F411 Generalized anxiety disorder: Secondary | ICD-10-CM | POA: Diagnosis not present

## 2020-10-05 MED ORDER — MODAFINIL 200 MG PO TABS: 100.0000 mg | ORAL_TABLET | Freq: Every day | ORAL | 5 refills | Status: DC | PRN

## 2020-10-05 MED ORDER — FLUOXETINE HCL 40 MG PO CAPS: 80.0000 mg | ORAL_CAPSULE | Freq: Every day | ORAL | 1 refills | Status: DC

## 2020-10-05 MED ORDER — CLONAZEPAM 0.5 MG PO TABS: 0.2500 mg | ORAL_TABLET | Freq: Two times a day (BID) | ORAL | 5 refills | Status: DC | PRN

## 2020-10-05 MED ORDER — GABAPENTIN 300 MG PO CAPS: ORAL_CAPSULE | ORAL | 1 refills | Status: DC

## 2020-10-05 NOTE — Progress Notes (Signed)
[facility]  [name],  MRN: [id number]  PCP: Rochel Brome, MD  Date of Evaluation: 10/05/2020 Time spent:20 minutes  Chief Complaint:  Chief Complaint    Anxiety; Depression      HISTORY/CURRENT STATUS: HPI For routine med check.  Doing well except for anxiety. She ran out of Gabapentin a while back so has felt more anxious and even had more pain in general. Not having PA, more generalized sense of unease. 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   Patient denies loss of interest in usual activities and is able to enjoy things.  Denies decreased energy or motivation.  Appetite has not changed.  No extreme sadness, tearfulness, or feelings of hopelessness.  Denies any changes in concentration, making decisions or remembering things. Sleeps good.  Denies suicidal or homicidal thoughts.  Patient denies increased energy with decreased need for sleep, no increased talkativeness, no racing thoughts, no impulsivity or risky behaviors, no increased spending, no increased libido, no grandiosity, no increased irritability or anger, and no hallucinations.  Denies dizziness, syncope, seizures, numbness, tingling, tremor, tics, unsteady gait, slurred speech, confusion. Denies muscle or joint pain, stiffness, or dystonia.   Individual Medical History/ Review of Systems: Changes? :No    Past medications for mental health diagnoses include: Lamictal, Abilify, Prozac, Zoloft, Celexa, Lexapro, Paxil, Cymbalta, Wellbutrin, lithium, Depakote, Seroquel, Risperdal, Zyprexa, gabapentin, Klonopin, Xanax, trazodone, BuSpar, Luvox, Ativan  Allergies: Oxycodone-acetaminophen and Nitrofurantoin  Current Medications:  Current Outpatient Medications:  .  AIMOVIG 140 MG/ML SOAJ, Inject 140 mg into the skin every 30 (thirty) days., Disp: 1 mL, Rfl: 11 .  b complex vitamins capsule, Take 1 capsule by mouth daily., Disp: 30 capsule, Rfl: 11 .  Cholecalciferol (VITAMIN D3) 50  MCG (2000 UT) capsule, Take 1 capsule (2,000 Units total) by mouth daily., Disp: 30 capsule, Rfl: 11 .  Fish Oil-Cholecalciferol (OMEGA-3 + D) 500-200 MG-UNIT CAPS, Take 1 capsule by mouth daily., Disp: 30 capsule, Rfl: 11 .  hydrOXYzine (ATARAX/VISTARIL) 10 MG tablet, Take 1-3 tablets (10-30 mg total) by mouth every 8 (eight) hours as needed., Disp: 90 tablet, Rfl: 1 .  Multiple Vitamins-Minerals (MULTIVITAMIN GUMMIES ADULT) CHEW, Chew 1 tablet by mouth daily., Disp: 30 tablet, Rfl: 11 .  rizatriptan (MAXALT-MLT) 10 MG disintegrating tablet, Take 1 tablet (10 mg total) by mouth as needed for migraine. May repeat in 2 hours if needed, Disp: 9 tablet, Rfl: 11 .  Vitamin D, Ergocalciferol, (DRISDOL) 1.25 MG (50000 UNIT) CAPS capsule, Take 50,000 Units by mouth every 7 (seven) days., Disp: , Rfl:  .  clonazePAM (KLONOPIN) 0.5 MG tablet, Take 0.5-1 tablets (0.25-0.5 mg total) by mouth 2 (two) times daily as needed for anxiety., Disp: 60 tablet, Rfl: 5 .  FLUoxetine (PROZAC) 40 MG capsule, Take 2 capsules (80 mg total) by mouth daily., Disp: 180 capsule, Rfl: 1 .  gabapentin (NEURONTIN) 300 MG capsule, TAKE ONE CAPSULE BY MOUTH EVERY IN THE MORNING, and 2 BY MOUTH EVERY evening 3-4 hours before bed., Disp: 270 capsule, Rfl: 1 .  Iron, Ferrous Gluconate, 256 (28 Fe) MG TABS, Take by mouth. (Patient not taking: Reported on 10/05/2020), Disp: , Rfl:  .  medroxyPROGESTERone (PROVERA) 10 MG tablet, Take 1 tablet (10 mg total) by mouth daily. (Patient not taking: No sig reported), Disp: 10 tablet, Rfl: 0 .  modafinil (PROVIGIL) 200 MG tablet, Take 0.5 tablets (100 mg total) by mouth daily as needed., Disp: 30 tablet, Rfl: 5 .  propranolol ER (INDERAL  LA) 80 MG 24 hr capsule, TAKE ONE CAPSULE BY MOUTH AT BEDTIME (Patient not taking: Reported on 10/05/2020), Disp: 180 capsule, Rfl: 6 .  UNABLE TO FIND, CPAP (Patient not taking: Reported on 10/05/2020), Disp: , Rfl:  Medication Side Effects: none  Family Medical/  Social History: Changes? No  MENTAL HEALTH EXAM:  There were no vitals taken for this visit.There is no height or weight on file to calculate BMI.  General Appearance: Casual, Neat and Well Groomed  Eye Contact:  Good  Speech:  Clear and Coherent and Normal Rate  Volume:  Normal  Mood:  Euthymic  Affect:  Appropriate  Thought Process:  Goal Directed and Descriptions of Associations: Intact  Orientation:  Full (Time, Place, and Person)  Thought Content: Logical   Suicidal Thoughts:  No  Homicidal Thoughts:  No  Memory:  WNL  Judgement:  Good  Insight:  Good  Psychomotor Activity:  Normal  Concentration:  Concentration: Good  Recall:  Good  Fund of Knowledge: Good  Language: Good  Assets:  Desire for Improvement  ADL's:  Intact  Cognition: WNL  Prognosis:  Good    DIAGNOSES:    ICD-10-CM   1. Depression, major, recurrent, in partial remission ([place])  F33.41   2. Generalized anxiety disorder  F41.1     Receiving Psychotherapy: No    RECOMMENDATIONS:  PDMP reviewed. I provided 20 mins of face to face time during this encounter, including time spent before and after appt in records review and charting. She's doing well, just need to get back on Gabapentin.  Continue Klonopin 0.5 mg, 1/2-1 po bid prn. Cont Prozac 40 mg, 2 qd. Restart gabapentin by taking 300 mg nightly for 4 nights and then increase to 1 p.o. twice daily.  If needed she can go back to her prior dose of 300 mg every morning and 600 mg at at bedtime. Continue hydroxyzine 10 mg, up to 3 p.o. 3 times daily as needed anxiety. Continue modafinil 200 mg, one half p.o. daily as needed. Return in 6 months.  Donnal Moat, PA-C

## 2020-10-28 ENCOUNTER — Other Ambulatory Visit: Payer: Self-pay | Admitting: Neurology

## 2020-10-28 DIAGNOSIS — G43709 Chronic migraine without aura, not intractable, without status migrainosus: Secondary | ICD-10-CM

## 2020-11-26 ENCOUNTER — Other Ambulatory Visit: Payer: Self-pay | Admitting: Neurology

## 2020-11-26 DIAGNOSIS — G43709 Chronic migraine without aura, not intractable, without status migrainosus: Secondary | ICD-10-CM

## 2020-12-10 ENCOUNTER — Encounter: Payer: Self-pay | Admitting: *Deleted

## 2020-12-11 ENCOUNTER — Other Ambulatory Visit: Payer: Self-pay | Admitting: Physician Assistant

## 2020-12-23 ENCOUNTER — Other Ambulatory Visit: Payer: Self-pay | Admitting: Neurology

## 2020-12-23 DIAGNOSIS — G43709 Chronic migraine without aura, not intractable, without status migrainosus: Secondary | ICD-10-CM

## 2021-01-15 ENCOUNTER — Other Ambulatory Visit: Payer: Self-pay | Admitting: Urology

## 2021-01-18 NOTE — Patient Instructions (Signed)
DUE TO COVID-19 ONLY ONE VISITOR IS ALLOWED TO COME WITH YOU AND STAY IN THE WAITING ROOM ONLY DURING PRE OP AND PROCEDURE DAY OF SURGERY. THE 1 VISITOR  MAY VISIT WITH YOU AFTER SURGERY IN YOUR PRIVATE ROOM DURING VISITING HOURS ONLY!                Amanda Marks     Your procedure is scheduled on: 01/22/21   Report to [facility] Main  Entrance   Report to admitting at   12:45 pm     Call this number if you have problems the morning of surgery [phone]    Remember: Do not eat food after Midnight.  You may have clear liquids until 11:30 am    CLEAR LIQUID DIET   Foods Allowed                                                                     Foods Excluded  Coffee and tea, regular and decaf                             liquids that you cannot  Plain Jell-O any favor except red or purple                                           see through such as: Fruit ices (not with fruit pulp)                                     milk, soups, orange juice  Iced Popsicles                                    All solid food Carbonated beverages, regular and diet                                    Cranberry, grape and apple juices Sports drinks like Gatorade Lightly seasoned clear broth or consume(fat free) Sugar, honey syrup   BRUSH YOUR TEETH MORNING OF SURGERY AND RINSE YOUR MOUTH OUT, NO CHEWING GUM CANDY OR MINTS.     Take these medicines the morning of surgery with A SIP OF WATER: gabapentin, Prozac, Tamsulosin                                You may not have any metal on your body including hair pins and              piercings  Do not wear jewelry, make-up, lotions, powders or perfumes, deodorant             Do not wear nail polish on your fingernails.  Do not shave  48 hours prior to surgery.               Do  not bring valuables to the hospital. [place].  Contacts, dentures or bridgework may not be worn into  surgery.       Patients discharged the day of surgery will not be allowed to drive home.  IF YOU ARE HAVING SURGERY AND GOING HOME THE SAME DAY, YOU MUST HAVE AN ADULT TO DRIVE YOU HOME AND BE WITH YOU FOR 24 HOURS.  YOU MAY GO HOME BY TAXI OR UBER OR ORTHERWISE, BUT AN ADULT MUST ACCOMPANY YOU HOME AND STAY WITH YOU FOR 24 HOURS.  Name and phone number of your driver:  Special Instructions: N/A              Please read over the following fact sheets you were given: _____________________________________________________________________             [facility] - Preparing for Surgery Before surgery, you can play an important role.  Because skin is not sterile, your skin needs to be as free of germs as possible.  You can reduce the number of germs on your skin by washing with CHG (chlorahexidine gluconate) soap before surgery.  CHG is an antiseptic cleaner which kills germs and bonds with the skin to continue killing germs even after washing. Please DO NOT use if you have an allergy to CHG or antibacterial soaps.  If your skin becomes reddened/irritated stop using the CHG and inform your nurse when you arrive at Short Stay. Do not shave (including legs and underarms) for at least 48 hours prior to the first CHG shower.    Please follow these instructions carefully:  1.  Shower with CHG Soap the night before surgery and the  morning of Surgery.  2.  If you choose to wash your hair, wash your hair first as usual with your  normal  shampoo.  3.  After you shampoo, rinse your hair and body thoroughly to remove the  shampoo.                                        4.  Use CHG as you would any other liquid soap.  You can apply chg directly  to the skin and wash                       Gently with a scrungie or clean washcloth.  5.  Apply the CHG Soap to your body ONLY FROM THE NECK DOWN.   Do not use on face/ open                           Wound or open sores. Avoid contact with eyes, ears mouth and  genitals (private parts).                       Wash face,  Genitals (private parts) with your normal soap.             6.  Wash thoroughly, paying special attention to the area where your surgery  will be performed.  7.  Thoroughly rinse your body with warm water from the neck down.  8.  DO NOT shower/wash with your normal soap after using and rinsing off  the CHG Soap.  9.  Pat yourself dry with a clean towel.            10.  Wear clean pajamas.            11.  Place clean sheets on your bed the night of your first shower and do not  sleep with pets. Day of Surgery : Do not apply any lotions/deodorants the morning of surgery.  Please wear clean clothes to the hospital/surgery center.  FAILURE TO FOLLOW THESE INSTRUCTIONS MAY RESULT IN THE CANCELLATION OF YOUR SURGERY PATIENT SIGNATURE_________________________________  NURSE SIGNATURE__________________________________  ________________________________________________________________________

## 2021-01-19 ENCOUNTER — Other Ambulatory Visit: Payer: Self-pay

## 2021-01-19 ENCOUNTER — Encounter (HOSPITAL_COMMUNITY): Payer: Self-pay

## 2021-01-19 ENCOUNTER — Encounter (HOSPITAL_COMMUNITY)
Admission: RE | Admit: 2021-01-19 | Discharge: 2021-01-19 | Disposition: A | Discharge status: Home / Self Care | Payer: BC Managed Care – PPO | Source: Ambulatory Visit | Attending: Urology | Admitting: Urology

## 2021-01-19 DIAGNOSIS — N132 Hydronephrosis with renal and ureteral calculous obstruction: Secondary | ICD-10-CM | POA: Diagnosis not present

## 2021-01-19 DIAGNOSIS — Z79899 Other long term (current) drug therapy: Secondary | ICD-10-CM | POA: Diagnosis not present

## 2021-01-19 DIAGNOSIS — Z01812 Encounter for preprocedural laboratory examination: Secondary | ICD-10-CM | POA: Insufficient documentation

## 2021-01-19 DIAGNOSIS — Z87442 Personal history of urinary calculi: Secondary | ICD-10-CM | POA: Diagnosis not present

## 2021-01-19 DIAGNOSIS — N201 Calculus of ureter: Secondary | ICD-10-CM | POA: Diagnosis present

## 2021-01-19 DIAGNOSIS — C9200 Acute myeloblastic leukemia, not having achieved remission: Secondary | ICD-10-CM | POA: Diagnosis not present

## 2021-01-19 DIAGNOSIS — Z87891 Personal history of nicotine dependence: Secondary | ICD-10-CM | POA: Diagnosis not present

## 2021-01-19 DIAGNOSIS — Z885 Allergy status to narcotic agent status: Secondary | ICD-10-CM | POA: Diagnosis not present

## 2021-01-19 HISTORY — DX: Personal history of urinary calculi: Z87.442

## 2021-01-19 LAB — BASIC METABOLIC PANEL
Anion gap: 8 (ref 5–15)
BUN: 17 mg/dL (ref 6–20)
CO2: 23 mmol/L (ref 22–32)
Calcium: 9.5 mg/dL (ref 8.9–10.3)
Chloride: 109 mmol/L (ref 98–111)
Creatinine, Ser: 0.53 mg/dL (ref 0.44–1.00)
GFR, Estimated: >60 mL/min (ref >60)
Glucose, Bld: 96 mg/dL (ref 70–99)
Potassium: 4 mmol/L (ref 3.5–5.1)
Sodium: 140 mmol/L (ref 135–145)

## 2021-01-19 LAB — CBC
HCT: 32.2 % — ABNORMAL LOW (ref 36.0–46.0)
Hemoglobin: 9.2 g/dL — ABNORMAL LOW (ref 12.0–15.0)
MCH: 19.7 pg — ABNORMAL LOW (ref 26.0–34.0)
MCHC: 28.6 g/dL — ABNORMAL LOW (ref 30.0–36.0)
MCV: 69 fL — ABNORMAL LOW (ref 80.0–100.0)
Platelets: 354 10*3/uL (ref 150–400)
RBC: 4.67 MIL/uL (ref 3.87–5.11)
RDW: 19.9 % — ABNORMAL HIGH (ref 11.5–15.5)
WBC: 7.8 10*3/uL (ref 4.0–10.5)
nRBC: 0 % (ref 0.0–0.2)

## 2021-01-19 NOTE — Progress Notes (Signed)
COVID Vaccine Completed:Yes Date COVID Vaccine completed:Pt report 2 shots and a booster COVID vaccine manufacturer:  Moderna    PCP - Irven Shelling NP LOV notes reguested Cardiologist - Dr.J. McGukin LOV 03/03/20  Chest x-ray - no EKG - 03/03/20 Stress Test - no ECHO - 2016 Cardiac Cath - no Pacemaker/ICD device last checked:NA  Sleep Study - yes CPAP - yes  Fasting Blood Sugar - NA Checks Blood Sugar _____ times a day  Blood Thinner Instructions:NA Aspirin Instructions: Last Dose:  Anesthesia review: yes  Patient denies shortness of breath, fever, cough and chest pain at PAT appointment Pt is able to climb 1 flight of stairs do housework and ADLs without SOB. She has chronic fatigue, uses a C-Pap and had anemia one year ago. She had an ECHO done in 2016 and doesn't remember why.  Patient verbalized understanding of instructions that were given to them at the PAT appointment. Patient was also instructed that they will need to review over the PAT instructions again at home before surgery. yes

## 2021-01-21 ENCOUNTER — Other Ambulatory Visit: Payer: Self-pay | Admitting: Physician Assistant

## 2021-01-21 ENCOUNTER — Other Ambulatory Visit: Payer: Self-pay | Admitting: Neurology

## 2021-01-21 DIAGNOSIS — G43709 Chronic migraine without aura, not intractable, without status migrainosus: Secondary | ICD-10-CM

## 2021-01-21 NOTE — H&P (Signed)
Office Visit Report     01/04/2021   --------------------------------------------------------------------------------   Amanda Marks  MRN: [id number]  DOB: 17-Jul-1975, 45 year old Female  SSN: -[id number]   PRIMARY CARE:    REFERRING:    PROVIDER:  Bjorn Loser, M.D.  TREATING:  Daine Gravel, NP  LOCATION:  [facility] [phone]     --------------------------------------------------------------------------------   CC/HPI: Was consulted to assess the patient for pelvic pain. In the last 1-2 weeks she is having a lot a urethral pain. Is pretty steady. It does spasms. It is daily. She has suprapubic discomfort. She went to the emergency room in another hospital and they said she had a stone. The gave her Vicodin. I am not certain if she was passing stone or was within the kidney period was done at [place].   She has had ureteroscopy before and has passed kidney stones. Not having back pain. No nausea or vomiting or gross hematuria. She was told she had microscopic hematuria   She voids every hour and every 2 hours at night   No hysterectomy. No neurologic issues.   01/04/2021: 45 year old female who was seen at [facility] on 12/24/20 with 2 weeks of low pelvic pain and discomfort associated with urinary frequency and urgency. She was found by CT scan to have a 3 mm right distal ureteral calculus with moderate right hydro ureter nephrosis. 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 She followed up with her urologist who placed her on medical expulsive therapy. She presents today for follow-up. She reports worsening frequency and urgency. She denies fevers and chills the she states she has continued right flank discomfort.     ALLERGIES: Percocet TABS    MEDICATIONS: Flomax 0.4 mg capsule 1 capsule PO Daily  Azo Urinary Pain Relief 99.5 mg tablet Oral  Clonazepam 0.5 mg tablet  DiazePAM 10 MG Oral Tablet 0 Oral  Fish Oil  Fluoxetine Hcl  Gabapentin   Hydrocodone-Acetaminophen 5 mg-325 mg tablet 1-2 tablet PO Q 6 H PRN  Motrin 800 mg tablet Oral  Multivitamin  Ondansetron Hcl 4 mg tablet 1 tablet PO Q 8 H PRN  Tylenol CAPS Oral  Vitamin D3     GU PSH: Cystoscopy And Treatment - 2016       PSH Notes: Cystoscopy With Manipulation Of Ureteral Calculus, Cholecystectomy, Tubal Ligation   NON-GU PSH: Cholecystectomy (open) - 2016 Hernia Repair Remove Kidney Stone Tubal Ligation - 2016     GU PMH: Acute Cystitis/UTI - 12/29/2020 Ureteral calculus - 12/29/2020, Right ureteral stone, - 2016 Interstitial Cystitis (w/o hematuria), 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 - 2016 Microscopic hematuria, Asymptomatic microscopic hematuria - 2016 Pelvic/perineal pain, Female pelvic pain - 2016    NON-GU PMH: Anxiety, Anxiety - 2016 Encounter for general adult medical examination without abnormal findings, Encounter for preventive health examination - 2016 Personal history of other diseases of the digestive system, History of esophageal reflux - 2016 Personal history of other mental and behavioral disorders, History of depression - 2016 GERD Hypercholesterolemia Sleep Apnea    FAMILY HISTORY: 2 daughters - Daughter Acute Myocardial Infarction - Runs In Family Diabetes - Runs In Family Hematuria - Runs In Family Hypertension - Runs In Family Kidney Failure - Father Kidney Stones - Runs In Family prostate cancer in father - Father Strokes - Runs In Family   SOCIAL HISTORY: Marital Status: Married Preferred Language: English; Ethnicity: Not Hispanic Or Latino Current Smoking Status: Patient has never smoked.   Tobacco  Use Assessment Completed: Used Tobacco in last 30 days? Has never drank.  Does not drink caffeine. Patient's occupation Arboriculturist.     Notes: Former smoker, Married, Caffeine use, No alcohol use, Occupation, Number of children, Former smoker   REVIEW OF SYSTEMS:    GU Review Female:   Patient  reports frequent urination, burning /pain with urination, and leakage of urine. Patient denies hard to postpone urination, get up at night to urinate, stream starts and stops, trouble starting your stream, have to strain to urinate, and being pregnant.  Gastrointestinal (Upper):   Patient reports nausea. Patient denies vomiting and indigestion/ heartburn.  Gastrointestinal (Lower):   Patient denies diarrhea and constipation.  Constitutional:   Patient denies fever, night sweats, weight loss, and fatigue.  Skin:   Patient denies skin rash/ lesion and itching.  Musculoskeletal:   Patient reports back pain. Patient denies joint pain.  Neurological:   Patient denies headaches and dizziness.  Psychologic:   Patient denies depression and anxiety.   VITAL SIGNS:      01/04/2021 10:04 AM  BP 136/86 mmHg  Pulse 80 /min  Temperature 97.8 F / 36.5 C   GU PHYSICAL EXAMINATION:    Breast: Symmetrical. No tenderness, no nipple discharge, no skin changes. No mass.  Digital Rectal Exam: Normal sphincter tone. No rectal mass.  External Genitalia: No hirsutism, no rash, no scarring, no cyst, no erythematous lesion, no papular lesion, no blanched lesion, no warty lesion. No edema.  Urethral Meatus: Normal size. Normal position. No discharge.  Urethra: No tenderness, no mass, no scarring. No hypermobility. No leakage.  Bladder: Normal to palpation, no tenderness, no mass, normal size.  Vagina: No atrophy, no stenosis. No rectocele. No cystocele. No enterocele.  Cervix: No inflammation, no discharge, no lesion, no tenderness, no wart.  Uterus: Normal size. Normal consistency. Normal position. No mobility. No descent.  Adnexa / Parametria: No tenderness. No adnexal mass. Normal left ovary. Normal right ovary.  Anus and Perineum: No hemorrhoids. No anal stenosis. No rectal fissure, no anal fissure. No edema, no dimple, no perineal tenderness, no anal tenderness.   MULTI-SYSTEM PHYSICAL EXAMINATION:     Constitutional: Well-nourished. No physical deformities. Normally developed. Good grooming.  Neck: Neck symmetrical, not swollen. Normal tracheal position.  Respiratory: No labored breathing, no use of accessory muscles.   Cardiovascular: Normal temperature, normal extremity pulses, no swelling, no varicosities.  Lymphatic: No enlargement of neck, axillae, groin.  Skin: No paleness, no jaundice, no cyanosis. No lesion, no ulcer, no rash.  Neurologic / Psychiatric: Oriented to time, oriented to place, oriented to person. No depression, no anxiety, no agitation.  Gastrointestinal: No mass, no tenderness, no rigidity, non obese abdomen.  Eyes: Normal conjunctivae. Normal eyelids.  Ears, Nose, Mouth, and Throat: Left ear no scars, no lesions, no masses. Right ear no scars, no lesions, no masses. Nose no scars, no lesions, no masses. Normal hearing. Normal lips.  Musculoskeletal: Normal gait and station of head and neck.     Complexity of Data:  Source Of History:  Patient  Records Review:   Previous Hospital Records, Previous Patient Records  Urine Test Review:   Urinalysis  X-Ray Review: KUB: Reviewed Films. Reviewed Report.  C.T. Abdomen/Pelvis: Reviewed Films. Reviewed Report. Adrenals/Urinary Tract: Unremarkable adrenal glands. Approximately 2-3 mm calculus within the distal right ureter near the ureterovesical junction (series 2, image 82) with resulting moderate right hydroureteronephrosis. Mild stranding around the distal right ureter. Small (8 mm) fatty exophytic lesion arising  from the left upper pole, compatible with an angiomyolipoma and similar to prior. Additional low attenuation left renal lesions, most likely cysts. No left-sided hydronephrosis.    PROCEDURES:         KUB - [id number]  A single view of the abdomen is obtained. Bilateral renal shadows are visualized. There is a nonobstructing bowel gas pattern. There are no appreciable opacities noted with in the left renal shadow there  are no appreciable opacities noted within the right renal shadow tracing along the course of the right ureter there are pelvic phleboliths that make it difficult to discern. There are no appreciable opacities noted along the left ureter. There are stable pelvic left-sided phleboliths.      Patient confirmed No Neulasta OnPro Device.           Urinalysis w/Scope - 81001 Dipstick Dipstick Cont'd Micro  Color: Red Bilirubin: Neg mg/dL WBC/hpf: 0 - 5/hpf  Appearance: Cloudy Ketones: Neg mg/dL RBC/hpf: >60/hpf  Specific Gravity: 1.020 Blood: 3+ ery/uL Bacteria: NS (Not Seen)  pH: 5.5 Protein: 2+ mg/dL Cystals: NS (Not Seen)  Glucose: Neg mg/dL Urobilinogen: 0.2 mg/dL Casts: NS (Not Seen)    Nitrites: Neg Trichomonas: Not Present    Leukocyte Esterase: 1+ leu/uL Mucous: Not Present      Epithelial Cells: 0 - 5/hpf      Yeast: NS (Not Seen)      Sperm: Not Present         Ketoralac '60mg'$  - [id number], [id number] The area was prepped and cleaned using sterile technique. A band aid was applied. The pt tolerated well.   Qty: 60 Adm. By: Robynn Pane  Unit: mg Lot No [id number]  Route: IM Exp. Date 12/11/2021  Freq: None Mfgr.:   Site: Left Buttock   ASSESSMENT:      ICD-10 Details  1 GU:   Ureteral calculus - N20.1 Right, Acute, Systemic Symptoms  2   Pelvic/perineal pain - R10.2 Chronic, Exacerbation   PLAN:            Medications New Meds: Ketorolac Tromethamine 10 mg tablet 1 tablet PO Q 6 H PRN Do not take with Ibuprofen or naproxen.  #20  0 Refill(s)            Orders Labs CULTURE, URINE          Schedule Return Visit/Planned Activity: Next Available Appointment - Schedule Surgery  Procedure: 01/04/2021 at [facility] - [phone] - Ketoralac '60mg'$  (Toradol Per 15 Mg) - [id number], [phone]          Document Letter(s):  Created for Patient: Clinical Summary         Notes:   Urinalysis sent for precautionary culture today. She is on her menstrual cycle which explains  hematuria however, she continues to be symptomatic with increased urinary frequency, dysuria, pelvic pain and pressure. I discussed options for definitive stone management including lithotripsy and ureteroscopy. On KUB there are multiple right-sided phleboliths and is difficult to discern if stone is present. Since she has not passed her stone she continues to be symptomatic, did advised the best course of action for distal stone is a ureteroscopy. The risks and the benefits of this were discussed in detail including the risk of infection, bleeding, general anesthesia, injury to surrounding structures. She verbalized her understanding to these risks and wished to proceed. IM Toradol was given to her today along with a script for this. I will attempt to get her set up for ureteroscopy within  the next few weeks. If symptoms subside prior to undergoing ureteroscopy, however, she does not see a stone pass, I would advise follow-up with a right-sided renal ultrasound in the next 2-3 weeks after symptom resolution. Strict return precautions were advised.     * Signed by Daine Gravel, NP on 01/04/21 at 10:59 AM (EDT)*      The information contained in this medical record document is considered private and confidential patient information. This information can only be used for the medical diagnosis and/or medical services that are being provided by the patient's selected caregivers. This information can only be distributed outside of the patient's care if the patient agrees and signs waivers of authorization for this information to be sent to an outside source or route.

## 2021-01-21 NOTE — Telephone Encounter (Signed)
Last filled 6/16

## 2021-01-22 ENCOUNTER — Encounter (HOSPITAL_COMMUNITY): Payer: Self-pay | Admitting: Urology

## 2021-01-22 ENCOUNTER — Encounter (HOSPITAL_COMMUNITY)
Admission: RE | Disposition: A | Discharge status: Home / Self Care | Payer: Self-pay | Source: Home / Self Care | Attending: Urology

## 2021-01-22 ENCOUNTER — Ambulatory Visit (HOSPITAL_COMMUNITY): Payer: BC Managed Care – PPO | Admitting: Anesthesiology

## 2021-01-22 ENCOUNTER — Ambulatory Visit (HOSPITAL_COMMUNITY): Payer: BC Managed Care – PPO

## 2021-01-22 ENCOUNTER — Ambulatory Visit (HOSPITAL_COMMUNITY): Payer: BC Managed Care – PPO | Admitting: Physician Assistant

## 2021-01-22 ENCOUNTER — Ambulatory Visit (HOSPITAL_COMMUNITY)
Admission: RE | Admit: 2021-01-22 | Discharge: 2021-01-22 | Disposition: A | Discharge status: Home / Self Care | Payer: BC Managed Care – PPO | Attending: Urology | Admitting: Urology

## 2021-01-22 DIAGNOSIS — N132 Hydronephrosis with renal and ureteral calculous obstruction: Secondary | ICD-10-CM | POA: Insufficient documentation

## 2021-01-22 DIAGNOSIS — Z885 Allergy status to narcotic agent status: Secondary | ICD-10-CM | POA: Insufficient documentation

## 2021-01-22 DIAGNOSIS — C9200 Acute myeloblastic leukemia, not having achieved remission: Secondary | ICD-10-CM | POA: Insufficient documentation

## 2021-01-22 DIAGNOSIS — Z87891 Personal history of nicotine dependence: Secondary | ICD-10-CM | POA: Insufficient documentation

## 2021-01-22 DIAGNOSIS — Z79899 Other long term (current) drug therapy: Secondary | ICD-10-CM | POA: Insufficient documentation

## 2021-01-22 DIAGNOSIS — N201 Calculus of ureter: Secondary | ICD-10-CM

## 2021-01-22 DIAGNOSIS — Z87442 Personal history of urinary calculi: Secondary | ICD-10-CM | POA: Insufficient documentation

## 2021-01-22 HISTORY — PX: CYSTOSCOPY/URETEROSCOPY/HOLMIUM LASER/STENT PLACEMENT: SHX6546

## 2021-01-22 SURGERY — CYSTOSCOPY/URETEROSCOPY/HOLMIUM LASER/STENT PLACEMENT
Anesthesia: General | Laterality: Right

## 2021-01-22 MED ORDER — KETOROLAC TROMETHAMINE 30 MG/ML IJ SOLN
INTRAMUSCULAR | Status: DC | PRN
  Administered 2021-01-22: 30 mg via INTRAVENOUS

## 2021-01-22 MED ORDER — FENTANYL CITRATE (PF) 100 MCG/2ML IJ SOLN
INTRAMUSCULAR | Status: AC
  Filled 2021-01-22: qty 2

## 2021-01-22 MED ORDER — MIDAZOLAM HCL 2 MG/2ML IJ SOLN
INTRAMUSCULAR | Status: AC
  Filled 2021-01-22: qty 2

## 2021-01-22 MED ORDER — PROPOFOL 10 MG/ML IV BOLUS
INTRAVENOUS | Status: DC | PRN
  Administered 2021-01-22: 150 mg via INTRAVENOUS

## 2021-01-22 MED ORDER — FENTANYL CITRATE (PF) 100 MCG/2ML IJ SOLN
50.0000 ug | INTRAMUSCULAR | Status: DC
Start: 2021-01-22 — End: 2021-01-22

## 2021-01-22 MED ORDER — GLYCOPYRROLATE 0.2 MG/ML IJ SOLN
INTRAMUSCULAR | Status: AC
  Filled 2021-01-22: qty 1

## 2021-01-22 MED ORDER — SCOPOLAMINE 1 MG/3DAYS TD PT72
1.0000 | MEDICATED_PATCH | Freq: Once | TRANSDERMAL | Status: DC
  Administered 2021-01-22: 1.5 mg via TRANSDERMAL
  Filled 2021-01-22: qty 1

## 2021-01-22 MED ORDER — ONDANSETRON HCL 4 MG/2ML IJ SOLN
INTRAMUSCULAR | Status: DC | PRN
  Administered 2021-01-22: 4 mg via INTRAVENOUS

## 2021-01-22 MED ORDER — TRAMADOL HCL 50 MG PO TABS: 50.0000 mg | ORAL_TABLET | Freq: Four times a day (QID) | ORAL | 0 refills | Status: AC | PRN

## 2021-01-22 MED ORDER — EPHEDRINE SULFATE-NACL 50-0.9 MG/10ML-% IV SOSY
PREFILLED_SYRINGE | INTRAVENOUS | Status: DC | PRN
  Administered 2021-01-22: 10 mg via INTRAVENOUS

## 2021-01-22 MED ORDER — TAMSULOSIN HCL 0.4 MG PO CAPS: 0.4000 mg | ORAL_CAPSULE | Freq: Every day | ORAL | 0 refills | Status: AC

## 2021-01-22 MED ORDER — FENTANYL CITRATE (PF) 100 MCG/2ML IJ SOLN
INTRAMUSCULAR | Status: DC | PRN
  Administered 2021-01-22: 50 ug via INTRAVENOUS
  Administered 2021-01-22: 25 ug via INTRAVENOUS

## 2021-01-22 MED ORDER — SODIUM CHLORIDE 0.9 % IR SOLN
Status: DC | PRN
  Administered 2021-01-22: 3000 mL

## 2021-01-22 MED ORDER — PHENYLEPHRINE 40 MCG/ML (10ML) SYRINGE FOR IV PUSH (FOR BLOOD PRESSURE SUPPORT)
PREFILLED_SYRINGE | INTRAVENOUS | Status: DC | PRN
  Administered 2021-01-22: 120 ug via INTRAVENOUS

## 2021-01-22 MED ORDER — 0.9 % SODIUM CHLORIDE (POUR BTL) OPTIME
TOPICAL | Status: DC | PRN
  Administered 2021-01-22: 1000 mL

## 2021-01-22 MED ORDER — PROPOFOL 10 MG/ML IV BOLUS
INTRAVENOUS | Status: AC
  Filled 2021-01-22: qty 20

## 2021-01-22 MED ORDER — ACETAMINOPHEN 500 MG PO TABS
1000.0000 mg | ORAL_TABLET | Freq: Once | ORAL | Status: AC
  Administered 2021-01-22: 1000 mg via ORAL
  Filled 2021-01-22: qty 2

## 2021-01-22 MED ORDER — IOHEXOL 300 MG/ML  SOLN
INTRAMUSCULAR | Status: DC | PRN
  Administered 2021-01-22: 9 mL

## 2021-01-22 MED ORDER — CHLORHEXIDINE GLUCONATE 0.12 % MT SOLN: 15.0000 mL | Freq: Once | OROMUCOSAL | Status: AC

## 2021-01-22 MED ORDER — DEXAMETHASONE SODIUM PHOSPHATE 10 MG/ML IJ SOLN
INTRAMUSCULAR | Status: DC | PRN
  Administered 2021-01-22: 10 mg via INTRAVENOUS

## 2021-01-22 MED ORDER — CEFAZOLIN SODIUM-DEXTROSE 2-4 GM/100ML-% IV SOLN
2.0000 g | Freq: Once | INTRAVENOUS | Status: AC
  Administered 2021-01-22: 2 g via INTRAVENOUS
  Filled 2021-01-22: qty 100

## 2021-01-22 MED ORDER — FENTANYL CITRATE (PF) 100 MCG/2ML IJ SOLN
INTRAMUSCULAR | Status: AC
  Administered 2021-01-22: 50 ug via INTRAVENOUS
  Filled 2021-01-22: qty 2

## 2021-01-22 MED ORDER — ORAL CARE MOUTH RINSE
15.0000 mL | Freq: Once | OROMUCOSAL | Status: AC
  Administered 2021-01-22: 15 mL via OROMUCOSAL

## 2021-01-22 MED ORDER — PROMETHAZINE HCL 25 MG/ML IJ SOLN: 6.2500 mg | INTRAMUSCULAR | Status: DC | PRN

## 2021-01-22 MED ORDER — GLYCOPYRROLATE 0.2 MG/ML IJ SOLN
INTRAMUSCULAR | Status: DC | PRN
  Administered 2021-01-22: .2 mg via INTRAVENOUS

## 2021-01-22 MED ORDER — LIDOCAINE 2% (20 MG/ML) 5 ML SYRINGE
INTRAMUSCULAR | Status: DC | PRN
  Administered 2021-01-22: 80 mg via INTRAVENOUS

## 2021-01-22 MED ORDER — FENTANYL CITRATE (PF) 100 MCG/2ML IJ SOLN
25.0000 ug | INTRAMUSCULAR | Status: DC | PRN
  Administered 2021-01-22 (×2): 50 ug via INTRAVENOUS

## 2021-01-22 MED ORDER — LIDOCAINE 2% (20 MG/ML) 5 ML SYRINGE
INTRAMUSCULAR | Status: AC
  Filled 2021-01-22: qty 5

## 2021-01-22 MED ORDER — KETOROLAC TROMETHAMINE 30 MG/ML IJ SOLN
INTRAMUSCULAR | Status: AC
  Filled 2021-01-22: qty 1

## 2021-01-22 MED ORDER — DEXAMETHASONE SODIUM PHOSPHATE 10 MG/ML IJ SOLN
INTRAMUSCULAR | Status: AC
  Filled 2021-01-22: qty 1

## 2021-01-22 MED ORDER — MIDAZOLAM HCL 5 MG/5ML IJ SOLN
INTRAMUSCULAR | Status: DC | PRN
  Administered 2021-01-22: 1 mg via INTRAVENOUS

## 2021-01-22 MED ORDER — PHENYLEPHRINE 40 MCG/ML (10ML) SYRINGE FOR IV PUSH (FOR BLOOD PRESSURE SUPPORT)
PREFILLED_SYRINGE | INTRAVENOUS | Status: AC
  Filled 2021-01-22: qty 10

## 2021-01-22 MED ORDER — LACTATED RINGERS IV SOLN: INTRAVENOUS | Status: DC

## 2021-01-22 MED ORDER — EPHEDRINE 5 MG/ML INJ
INTRAVENOUS | Status: AC
  Filled 2021-01-22: qty 5

## 2021-01-22 MED ORDER — ONDANSETRON HCL 4 MG/2ML IJ SOLN
INTRAMUSCULAR | Status: AC
  Filled 2021-01-22: qty 2

## 2021-01-22 SURGICAL SUPPLY — 23 items
BAG URO CATCHER STRL LF (MISCELLANEOUS) ×3 IMPLANT
BASKET ZERO TIP NITINOL 2.4FR (BASKET) IMPLANT
CATH INTERMIT  6FR 70CM (CATHETERS) ×3 IMPLANT
CATH URET 5FR 28IN CONE TIP (BALLOONS)
CATH URET 5FR 70CM CONE TIP (BALLOONS) IMPLANT
CLOTH BEACON ORANGE TIMEOUT ST (SAFETY) ×3 IMPLANT
EXTRACTOR STONE NITINOL NGAGE (UROLOGICAL SUPPLIES) ×3 IMPLANT
FIBER LASER MOSES 200 DFL (Laser) ×3 IMPLANT
GLOVE SURG ENC MOIS LTX SZ7.5 (GLOVE) ×6 IMPLANT
GOWN STRL REUS W/TWL XL LVL3 (GOWN DISPOSABLE) ×6 IMPLANT
GUIDEWIRE STR DUAL SENSOR (WIRE) ×3 IMPLANT
KIT TURNOVER KIT A (KITS) ×3 IMPLANT
LASER FIB FLEXIVA PULSE ID 365 (Laser) IMPLANT
MANIFOLD NEPTUNE II (INSTRUMENTS) ×3 IMPLANT
PACK CYSTO (CUSTOM PROCEDURE TRAY) ×3 IMPLANT
SHEATH URETERAL 12FRX28CM (UROLOGICAL SUPPLIES) IMPLANT
SHEATH URETERAL 12FRX35CM (MISCELLANEOUS) IMPLANT
STENT URET 6FRX24 CONTOUR (STENTS) ×3 IMPLANT
TRACTIP FLEXIVA PULS ID 200XHI (Laser) IMPLANT
TRACTIP FLEXIVA PULSE ID 200 (Laser)
TUBING CONNECTING 10 (TUBING) ×2 IMPLANT
TUBING CONNECTING 10' (TUBING) ×1
TUBING UROLOGY SET (TUBING) ×3 IMPLANT

## 2021-01-22 NOTE — Op Note (Addendum)
Preoperative Diagnosis: Right distal ureteral calculus  Postoperative Diagnosis:  Same  Procedure(s) Performed:   1. Cystourethroscopy 2. Right ureteroscopic stone extraction with laser lithotripsy 3. Right retrograde pyelogram 4. Right ureteral stent placement , 6Fr x 24cm JJ ureteral stent with dangler 5. Intraoperative fluoroscopy with interpretation <1hr.   Surgeon: Festus Aloe, MD  Resident Surgeon: Bishop Limbo, MD  Assistant(s):  None  Anesthesia:  General via LMA  Fluids:  See anesthesia record  Estimated blood loss: Minimal  Specimens:  Right  calculus for stone analysis  Cultures: None  Drains:  RIGHT 6 Fr x 24cm JJ ureteral stent with dangler  Complications:  None  Indications: 45 y.o. patient with a history of right distal ureteral stone with persistent symptoms and pain. . Risks & benefits of the procedure discussed with the patient, who wishes to proceed.  Findings:  Stone encountered in distal ureter and treated successfully with laser lithotripsy and basket extraction  Radiologic Interpretation of Retrograde Pyelogram: Right retrograde pyelogram demonstrated no contrast extravasation, filling defects. She did have mild right hydronephrosis.   Description:  The patient was correctly identified in the preop holding area where written informed consent as well potential risk and complication reviewed. The patient agreed. They were brought back to the operative suite where a preinduction timeout was performed. Once correct information was verified, general anesthesia was induced via LMA. They were then gently placed into dorsal lithotomy position with SCDs in place for VTE prophylaxis. They were prepped and draped in the usual sterile fashion and given appropriate preoperative antibiotics with Ancef. A second timeout was then performed.   We inserted a 17F rigid cystoscope per urethra with copious lubrication and normal saline irrigation running. This  demonstrated findings as described above.    We cannulated the right ureteral orifice with the combination of a sensor wire and 5Fr open ended catheter and the sensor wire was advanced into the expected location of the right renal pelvis without difficulty. We were not able to visualize a radioopacity on fluoroscopy. The 5Fr open ended catheter was then advanced over the sensor wire into the right distal ureter and the sensor wire was removed. A retrograde pyelogram was performed with findings as noted above. We then replaced the sensor wire into the right kidney and the 5Fr open ended catheter was removed. We then removed the cystoscope leaving our sensor wire in place.  We then advanced a dual channel semirigid ureteroscope and passed this along side the sensor wire and performed distal ureteroscopy which showed stone just proximal to the right UVJ. We then encountered the stone which appeared yellow, irregularly shaped.   We then obtained a 200 micron holmium laser fiber and performed laser lithotripsy until all of the stone burden was fragmented into several small fragments which were basket extracted using a zero-tip nitinol wire basket without complication. We then re-explored the location of stone treatment which demonstrated no residual sizable fragments.  At this point we elected to leave a ureteral stent and withdrew our instruments leaving a sensor wire in place.  We then advanced a 6Fr x 24 cm JJ ureteral stent with dangler with the assistance of a stent pusher under direct fluoroscopic & visual guidance without difficultly.  Sensor wire removal demonstrated satisfactory stent curl proximally in the renal pelvis and distally in the bladder. The bladder was emptied and all instrumentation was removed. The patient was woken up from anesthesia and taken to the recovery unit for routine postoperative care.   Post  Op Plan:   1. Pull stent on Monday 2. Follow up in 6 weeks with RUS requested to see  Daine Gravel 3. Prn analgesics  I was present and scrubbed for the entire procedure.

## 2021-01-22 NOTE — Anesthesia Procedure Notes (Signed)
Procedure Name: LMA Insertion Date/Time: 01/22/2021 3:34 PM Performed by: Sharlette Dense, CRNA Patient Re-evaluated:Patient Re-evaluated prior to induction Oxygen Delivery Method: Circle system utilized Preoxygenation: Pre-oxygenation with 100% oxygen Induction Type: IV induction LMA: LMA inserted LMA Size: 4.0 Number of attempts: 1 Placement Confirmation: positive ETCO2 and breath sounds checked- equal and bilateral Tube secured with: Tape Dental Injury: Teeth and Oropharynx as per pre-operative assessment

## 2021-01-22 NOTE — Anesthesia Preprocedure Evaluation (Addendum)
Anesthesia Evaluation  Patient identified by MRN, date of birth, ID band Patient awake    Reviewed: Allergy & Precautions, NPO status , Patient's Chart, lab work & pertinent test results, reviewed documented beta blocker date and time   Airway Mallampati: III  TM Distance: >3 FB Neck ROM: Full    Dental  (+) Teeth Intact, Dental Advisory Given   Pulmonary sleep apnea and Continuous Positive Airway Pressure Ventilation , former smoker,    Pulmonary exam normal breath sounds clear to auscultation       Cardiovascular hypertension, Normal cardiovascular exam Rhythm:Regular Rate:Normal     Neuro/Psych  Headaches, PSYCHIATRIC DISORDERS Anxiety Depression    GI/Hepatic negative GI ROS, Neg liver ROS,   Endo/Other  Obesity   Renal/GU Renal disease (RIGHT DISTAL URETEROVESICAL JUNCTION STONE)     Musculoskeletal negative musculoskeletal ROS (+)   Abdominal   Peds  Hematology  (+) Blood dyscrasia, anemia ,   Anesthesia Other Findings Day of surgery medications reviewed with the patient.  Reproductive/Obstetrics                           Anesthesia Physical Anesthesia Plan  ASA: 2  Anesthesia Plan: General   Post-op Pain Management:    Induction: Intravenous  PONV Risk Score and Plan: 4 or greater and Dexamethasone, Ondansetron, Midazolam and Scopolamine patch - Pre-op  Airway Management Planned: LMA  Additional Equipment:   Intra-op Plan:   Post-operative Plan: Extubation in OR  Informed Consent: I have reviewed the patients History and Physical, chart, labs and discussed the procedure including the risks, benefits and alternatives for the proposed anesthesia with the patient or authorized representative who has indicated his/her understanding and acceptance.     Dental advisory given  Plan Discussed with: CRNA  Anesthesia Plan Comments:         Anesthesia Quick  Evaluation

## 2021-01-22 NOTE — Anesthesia Postprocedure Evaluation (Signed)
Anesthesia Post Note  Patient: Amanda Marks  Procedure(s) Performed: CYSTOSCOPY/RETROGRADE/URETEROSCOPY/HOLMIUM LASER/STENT PLACEMENT (Right)     Patient location during evaluation: PACU Anesthesia Type: General Level of consciousness: awake and alert Pain management: pain level controlled Vital Signs Assessment: post-procedure vital signs reviewed and stable Respiratory status: spontaneous breathing, nonlabored ventilation and respiratory function stable Cardiovascular status: blood pressure returned to baseline and stable Postop Assessment: no apparent nausea or vomiting Anesthetic complications: no   No notable events documented.  Last Vitals:  Vitals:   01/22/21 1700 01/22/21 1709  BP:  138/76  Pulse: 68 62  Resp: 16 16  Temp: 37 C   SpO2: 93% 95%    Last Pain:  Vitals:   01/22/21 1709  TempSrc:   PainSc: 4                  Catalina Gravel

## 2021-01-22 NOTE — Discharge Instructions (Addendum)
Please remove the stent on a string on Monday morning 01/25/21.   You may see some blood in the urine and may have some burning with urination for 48-72 hours. You also may notice that you have to urinate more frequently or urgently after your procedure which is normal.  You should call should you develop an inability urinate, fever > 101, persistent nausea and vomiting that prevents you from eating or drinking to stay hydrated.  You have a stent, you will likely urinate more frequently and urgently until the stent is removed and you may experience some discomfort/pain in the lower abdomen and flank especially when urinating. You may take pain medication prescribed to you if needed for pain. You may also intermittently have blood in the urine until the stent is removed. Removal of the stent: remove the stent on Monday morning, Jan 25, 2021, by pulling the string as instructed If you have a catheter, you will be taught how to take care of the catheter by the nursing staff prior to discharge from the hospital.  You may periodically feel a strong urge to void with the catheter in place.  This is a bladder spasm and most often can occur when having a bowel movement or moving around. It is typically self-limited and usually will stop after a few minutes.  You may use some Vaseline or Neosporin around the tip of the catheter to reduce friction at the tip of the penis. You may also see some blood in the urine.  A very small amount of blood can make the urine look quite red.  As long as the catheter is draining well, there usually is not a problem.  However, if the catheter is not draining well and is bloody, you should call the office [phone]) to notify us.

## 2021-01-22 NOTE — Interval H&P Note (Signed)
History and Physical Interval Note:  01/22/2021 2:25 PM  Amanda Marks  has presented today for surgery, with the diagnosis of RIGHT DISTAL URETEROVESICAL JUNCTION STONE.  The various methods of treatment have been discussed with the patient and family. After consideration of risks, benefits and other options for treatment, the patient has consented to  Procedure(s) with comments: CYSTOSCOPY/RETROGRADE/URETEROSCOPY/HOLMIUM LASER/STENT PLACEMENT (Right) - ONLY NEEDS 60 MIN as a surgical intervention.  The patient's history has been reviewed, patient examined, no change in status, stable for surgery.  I have reviewed the patient's chart and labs. She continues to have crampy RLQ pain. She has not seen a stone pass. She has had no dysuria or fever.   Questions were answered to the patient's satisfaction.  Discussed she may have passed the stone . Also discussed procedure, stent care, post-op course, etc.    Festus Aloe

## 2021-01-22 NOTE — Transfer of Care (Signed)
Immediate Anesthesia Transfer of Care Note  Patient: Amanda Marks  Procedure(s) Performed: CYSTOSCOPY/RETROGRADE/URETEROSCOPY/HOLMIUM LASER/STENT PLACEMENT (Right)  Patient Location: PACU  Anesthesia Type:General  Level of Consciousness: awake and alert   Airway & Oxygen Therapy: Patient Spontanous Breathing and Patient connected to face mask oxygen  Post-op Assessment: Report given to RN and Post -op Vital signs reviewed and stable  Post vital signs: Reviewed and stable  Last Vitals:  Vitals Value Taken Time  BP    Temp    Pulse 74 01/22/21 1622  Resp 16 01/22/21 1622  SpO2 100 % 01/22/21 1622  Vitals shown include unvalidated device data.  Last Pain:  Vitals:   01/22/21 1324  TempSrc:   PainSc: 4          Complications: No notable events documented.

## 2021-01-23 ENCOUNTER — Encounter (HOSPITAL_COMMUNITY): Payer: Self-pay | Admitting: Urology

## 2021-01-26 ENCOUNTER — Encounter: Payer: Self-pay | Admitting: *Deleted

## 2021-01-26 ENCOUNTER — Telehealth: Payer: Self-pay | Admitting: *Deleted

## 2021-01-26 NOTE — Telephone Encounter (Signed)
Called Pt to ask some extended questions for her PA for her Aimovig. Thank patient for answering my questions .

## 2021-01-26 NOTE — Telephone Encounter (Signed)
Amanda Marks (Key: [id number]) Rx #: [id number] Aimovig '140MG'$ /ML auto-injectors  Finished PA waiting on approval of Aimovig

## 2021-02-05 ENCOUNTER — Other Ambulatory Visit: Payer: Self-pay | Admitting: Physician Assistant

## 2021-02-09 NOTE — Telephone Encounter (Signed)
We haven't seen patient since 07/2019. Need appt to update clinical notes and discuss medications.

## 2021-02-09 NOTE — Telephone Encounter (Signed)
Per CMM, Aimovig denied but no explanation was given.

## 2021-02-22 NOTE — Telephone Encounter (Signed)
We received a request from [facility] for PA on Aimovig.  We have previously reached out to patient but have not heard back from her.  I faxed a message to the pharmacy stating we must see patient for PAs.  She has not been seen in over 1.5 years. Received a receipt of confirmation.

## 2021-03-08 ENCOUNTER — Other Ambulatory Visit: Payer: Self-pay | Admitting: Physician Assistant

## 2021-03-09 ENCOUNTER — Other Ambulatory Visit: Payer: Self-pay | Admitting: Physician Assistant

## 2021-03-09 NOTE — Telephone Encounter (Signed)
This was filled 9/12, they are requesting next refill. Not sure you want to send?

## 2021-03-09 NOTE — Telephone Encounter (Signed)
No, too early

## 2021-03-18 ENCOUNTER — Other Ambulatory Visit: Payer: Self-pay | Admitting: Physician Assistant

## 2021-03-23 NOTE — Telephone Encounter (Signed)
Pt due for refill now

## 2021-04-06 ENCOUNTER — Other Ambulatory Visit: Payer: Self-pay

## 2021-04-06 ENCOUNTER — Ambulatory Visit: Payer: BC Managed Care – PPO | Admitting: Physician Assistant

## 2021-04-06 ENCOUNTER — Encounter: Payer: Self-pay | Admitting: Physician Assistant

## 2021-04-06 DIAGNOSIS — R5383 Other fatigue: Secondary | ICD-10-CM

## 2021-04-06 DIAGNOSIS — F411 Generalized anxiety disorder: Secondary | ICD-10-CM | POA: Diagnosis not present

## 2021-04-06 DIAGNOSIS — F3341 Major depressive disorder, recurrent, in partial remission: Secondary | ICD-10-CM | POA: Diagnosis not present

## 2021-04-06 DIAGNOSIS — G4700 Insomnia, unspecified: Secondary | ICD-10-CM

## 2021-04-06 MED ORDER — MODAFINIL 200 MG PO TABS: 100.0000 mg | ORAL_TABLET | Freq: Every day | ORAL | 5 refills | Status: DC | PRN

## 2021-04-06 MED ORDER — FLUOXETINE HCL 40 MG PO CAPS: 80.0000 mg | ORAL_CAPSULE | Freq: Every day | ORAL | 1 refills | Status: DC

## 2021-04-06 MED ORDER — CLONAZEPAM 0.5 MG PO TABS: ORAL_TABLET | ORAL | 5 refills | Status: DC

## 2021-04-06 NOTE — Progress Notes (Signed)
[facility] Med Check  [name],  MRN: [id number]  PCP: Amanda Shutter, NP  Date of Evaluation: 04/06/2021 Time spent:20 minutes  Chief Complaint:  Chief Complaint   Anxiety; Depression; Follow-up      HISTORY/CURRENT STATUS: HPI For routine med check.  Feels that meds are working as good as they can.  Her daughter has autism and is now a teenager.  There can be difficulty with her attitudes because of that, she has a teenage body but the mentality of a toddler.  Her daughter was sick a lot and in the hospital over the summer due to inflammatory bowel disease.  She is better now though.  Other than the circumstances, Amanda Marks states she is doing well.  Work is good.  She likes her job a lot.  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  She does have trouble sleeping during the day but states she gets by.  Modafinil has helped with energy and motivation, that is mostly caused from not sleeping enough and at the right hours of the day.  Patient denies loss of interest in usual activities and is able to enjoy things.  Appetite has not changed.  No extreme sadness, tearfulness, or feelings of hopelessness.  Denies any changes in concentration, making decisions or remembering things.  Denies suicidal or homicidal thoughts.  Still has anxiety at times, she does use the hydroxyzine sometimes and the Klonopin as well.  She has a lot of hydroxyzine that were prescribed for her daughter and she is now no longer taking it so the patient is using hers.  It is exactly the same dose.  Both the Klonopin and hydroxyzine are helpful.  Patient denies increased energy with decreased need for sleep, no increased talkativeness, no racing thoughts, no impulsivity or risky behaviors, no increased spending, no increased libido, no grandiosity, no increased irritability or anger, and no hallucinations.  Denies dizziness, syncope, seizures, numbness, tingling, tremor,  tics, unsteady gait, slurred speech, confusion. Denies muscle or joint pain, stiffness, or dystonia.   Individual Medical History/ Review of Systems: Changes? :Yes   kidney stone removed.   Past medications for mental health diagnoses include: Lamictal, Abilify, Prozac, Zoloft, Celexa, Lexapro, Paxil, Cymbalta, Wellbutrin, lithium, Depakote, Seroquel, Risperdal, Zyprexa, gabapentin, Klonopin, Xanax, trazodone, BuSpar, Luvox, Ativan  Allergies: Patient has no known allergies.  Current Medications:  Current Outpatient Medications:    b complex vitamins capsule, Take 1 capsule by mouth daily., Disp: 30 capsule, Rfl: 11   Cholecalciferol (VITAMIN D3) 50 MCG (2000 UT) capsule, Take 1 capsule (2,000 Units total) by mouth daily., Disp: 30 capsule, Rfl: 11   Fish Oil-Cholecalciferol (OMEGA-3 + D) 500-200 MG-UNIT CAPS, Take 1 capsule by mouth daily., Disp: 30 capsule, Rfl: 11   gabapentin (NEURONTIN) 300 MG capsule, TAKE ONE CAPSULE BY MOUTH EVERY MORNING, and 2 capsules BY MOUTH EVERY evening 3-4 hours before bed. (Patient taking differently: Take 900 mg by mouth at bedtime. TAKE ONE CAPSULE BY MOUTH EVERY MORNING, and 2 capsules BY MOUTH EVERY evening 3-4 hours before bed.), Disp: 270 capsule, Rfl: 0   hydrOXYzine (ATARAX/VISTARIL) 10 MG tablet, Take 1-3 tablets (10-30 mg total) by mouth every 8 (eight) hours as needed., Disp: 90 tablet, Rfl: 1   Multiple Vitamins-Minerals (MULTIVITAMIN GUMMIES ADULT) CHEW, Chew 1 tablet by mouth daily., Disp: 30 tablet, Rfl: 11   UNABLE TO FIND, CPAP, Disp: , Rfl:    clonazePAM (KLONOPIN) 0.5 MG tablet, Take 0.5-1 tablets (  0.25-0.5 mg total) by mouth 2 times daily as needed for anxiety., Disp: 60 tablet, Rfl: 5   Erenumab-aooe (AIMOVIG) 140 MG/ML SOAJ, Inject once monthly. needs appointment for any further refills. (Patient not taking: Reported on 04/06/2021), Disp: 1 mL, Rfl: 4   FLUoxetine (PROZAC) 40 MG capsule, Take 2 capsules (80 mg total) by mouth daily., Disp:  180 capsule, Rfl: 1   medroxyPROGESTERone (PROVERA) 10 MG tablet, Take 1 tablet (10 mg total) by mouth daily. (Patient not taking: Reported on 04/06/2021), Disp: 10 tablet, Rfl: 0   modafinil (PROVIGIL) 200 MG tablet, Take 0.5 tablets (100 mg total) by mouth daily as needed., Disp: 30 tablet, Rfl: 5   propranolol ER (INDERAL LA) 80 MG 24 hr capsule, TAKE ONE CAPSULE BY MOUTH AT BEDTIME (Patient not taking: No sig reported), Disp: 180 capsule, Rfl: 6   rizatriptan (MAXALT-MLT) 10 MG disintegrating tablet, Take 1 tablet (10 mg total) by mouth as needed for migraine. May repeat in 2 hours if needed (Patient not taking: Reported on 04/06/2021), Disp: 9 tablet, Rfl: 11   Vitamin D, Ergocalciferol, (DRISDOL) 1.25 MG (50000 UNIT) CAPS capsule, Take 50,000 Units by mouth every 7 (seven) days. (Patient not taking: Reported on 04/06/2021), Disp: , Rfl:  Medication Side Effects: none  Family Medical/ Social History: Changes? No  MENTAL HEALTH EXAM:  There were no vitals taken for this visit.There is no height or weight on file to calculate BMI.  General Appearance: Casual, Neat and Well Groomed  Eye Contact:  Good  Speech:  Clear and Coherent and Normal Rate  Volume:  Normal  Mood:  Euthymic  Affect:  Appropriate  Thought Process:  Goal Directed and Descriptions of Associations: Intact  Orientation:  Full (Time, Place, and Person)  Thought Content: Logical   Suicidal Thoughts:  No  Homicidal Thoughts:  No  Memory:  WNL  Judgement:  Good  Insight:  Good  Psychomotor Activity:  Normal  Concentration:  Concentration: Good  Recall:  Good  Fund of Knowledge: Good  Language: Good  Assets:  Desire for Improvement  ADL's:  Intact  Cognition: WNL  Prognosis:  Good    DIAGNOSES:    ICD-10-CM   1. Depression, major, recurrent, in partial remission ([place])  F33.41     2. Generalized anxiety disorder  F41.1     3. Insomnia, unspecified type  G47.00     4. Fatigue, unspecified type  R53.83         Receiving Psychotherapy: No    RECOMMENDATIONS:  PDMP reviewed.  Last Klonopin filled 03/23/2021.  Last modafinil filled 02/19/2021. I provided 20 minutes of face to face time during this encounter, including time spent before and after the visit in records review, medical decision making, and charting.  She continues to do well as far as medications go so no changes will be made. Continue Klonopin 0.5 mg, 1/2-1 po bid prn. Cont Prozac 40 mg, 2 qd. Continue gabapentin 900 mg a few hours before bedtime.   Continue hydroxyzine 10 mg, up to 3 p.o. 3 times daily as needed anxiety. Continue modafinil 200 mg, one half p.o. daily as needed. Return in 6 months.  Donnal Moat, PA-C

## 2021-05-26 NOTE — Telephone Encounter (Signed)
ERROR

## 2021-07-12 ENCOUNTER — Telehealth: Payer: Self-pay | Admitting: Physician Assistant

## 2021-07-12 NOTE — Telephone Encounter (Signed)
I checked in CMM and there are 2, one for generic and one for brand.

## 2021-07-12 NOTE — Telephone Encounter (Signed)
Pharmacy lvm stating that sent over PA for patient's Modafinil. They would like an update Please rtc to Otila Kluver at [place]. Ph: [phone]

## 2021-07-13 NOTE — Telephone Encounter (Signed)
I submitted the generic, that has been what she's been on previously. Not sure why they sent brand too. Thanks for checking.

## 2021-07-13 NOTE — Telephone Encounter (Signed)
Received a PA Aimovig patient has not been seen since 2021 . LVM for patient to call be back to discuss  refill request

## 2021-07-27 ENCOUNTER — Telehealth: Payer: Self-pay

## 2021-07-27 NOTE — Telephone Encounter (Signed)
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  I will document in my note at the next visit if so or can do an addendum.  Another option of course is to send to [facility].  Whichever is easier for you Traci, and inexpensive for the patient.  Thank you.

## 2021-07-27 NOTE — Telephone Encounter (Signed)
Prior Authorization submitted and DENIED for Modafinil 200 mg with [employer]. Diagnosis submitted of Somnolence was not one of the diagnosis accepted like with her previous [employer].   Covered diagnosis are 1. Adjunctive treatment for depression. 2. Excessive Daytime Sleepiness Associated with Myotonic Dystrophy. 3. Excessive Daytime Sleepiness Associated with Narcolepsy. 4. Excessive Daytime Sleepiness Associated with OSAHS. 5. Excessive Daytime Sleepiness Associated with Parkinson's Disease. 6. Excessive Daytime Sleepiness Associated with Shift Work Sleep Disorder. 7. Fatigue Associated with MS. Or 8. Idiopathic Hypersomnia.

## 2021-07-28 NOTE — Telephone Encounter (Signed)
Noted. Thanks Helene Kelp, yes I will resend with information.

## 2021-07-28 NOTE — Telephone Encounter (Signed)
Noted  

## 2021-08-04 ENCOUNTER — Other Ambulatory Visit: Payer: Self-pay | Admitting: Physician Assistant

## 2021-10-05 ENCOUNTER — Ambulatory Visit (INDEPENDENT_AMBULATORY_CARE_PROVIDER_SITE_OTHER): Payer: Self-pay | Admitting: Physician Assistant

## 2021-10-05 DIAGNOSIS — Z91199 Patient's noncompliance with other medical treatment and regimen due to unspecified reason: Secondary | ICD-10-CM

## 2021-10-05 NOTE — Progress Notes (Signed)
   Complete physical exam  Patient: Amanda Marks   DOB: 04/02/2000   46 y.o. Female  MRN: [id number]  Subjective:    No chief complaint on file.   Amanda Marks is a 46 y.o. female who presents today for a complete physical exam. She reports consuming a {diet types:17450} diet. {types:19826} She generally feels {DESC; WELL/FAIRLY WELL/POORLY:18703}. She reports sleeping {DESC; WELL/FAIRLY WELL/POORLY:18703}. She {does/does not:200015} have additional problems to discuss today.    Most recent fall risk assessment:    12/08/2021   10:42 AM  Fall Risk   Falls in the past year? 0  Number falls in past yr: 0  Injury with Fall? 0  Risk for fall due to : No Fall Risks  Follow up Falls evaluation completed     Most recent depression screenings:    12/08/2021   10:42 AM 10/29/2020   10:46 AM  PHQ 2/9 Scores  PHQ - 2 Score 0 0  PHQ- 9 Score 5     {VISON DENTAL STD PSA (Optional):27386}  {History (Optional):23778}  Patient Care Team: Jessup, Joy, NP as PCP - General (Nurse Practitioner)   Outpatient Medications Prior to Visit  Medication Sig   fluticasone (FLONASE) 50 MCG/ACT nasal spray Place 2 sprays into both nostrils in the morning and at bedtime. After 7 days, reduce to once daily.   norgestimate-ethinyl estradiol (SPRINTEC 28) 0.25-35 MG-MCG tablet Take 1 tablet by mouth daily.   Nystatin POWD Apply liberally to affected area 2 times per day   spironolactone (ALDACTONE) 100 MG tablet Take 1 tablet (100 mg total) by mouth daily.   No facility-administered medications prior to visit.    ROS        Objective:     There were no vitals taken for this visit. {Vitals History (Optional):23777}  Physical Exam   No results found for any visits on 01/13/22. {Show previous labs (optional):23779}    Assessment & Plan:    Routine Health Maintenance and Physical Exam  Immunization History  Administered Date(s) Administered   DTaP 06/16/1999, 08/12/1999,  10/21/1999, 07/06/2000, 01/20/2004   Hepatitis A 11/16/2007, 11/21/2008   Hepatitis B 04/03/1999, 05/11/1999, 10/21/1999   HiB (PRP-OMP) 06/16/1999, 08/12/1999, 10/21/1999, 07/06/2000   IPV 06/16/1999, 08/12/1999, 04/10/2000, 01/20/2004   Influenza,inj,Quad PF,6+ Mos 02/21/2014   Influenza-Unspecified 05/23/2012   MMR 04/10/2001, 01/20/2004   Meningococcal Polysaccharide 11/21/2011   Pneumococcal Conjugate-13 07/06/2000   Pneumococcal-Unspecified 10/21/1999, 01/04/2000   Tdap 11/21/2011   Varicella 04/10/2000, 11/16/2007    Health Maintenance  Topic Date Due   HIV Screening  Never done   Hepatitis C Screening  Never done   INFLUENZA VACCINE  01/11/2022   PAP-Cervical Cytology Screening  01/13/2022 (Originally 04/01/2020)   PAP SMEAR-Modifier  01/13/2022 (Originally 04/01/2020)   TETANUS/TDAP  01/13/2022 (Originally 11/20/2021)   HPV VACCINES  Discontinued   COVID-19 Vaccine  Discontinued    Discussed health benefits of physical activity, and encouraged her to engage in regular exercise appropriate for her age and condition.  Problem List Items Addressed This Visit   None Visit Diagnoses     Annual physical exam    -  Primary   Cervical cancer screening       Need for Tdap vaccination          No follow-ups on file.     Joy Jessup, NP   

## 2021-10-11 ENCOUNTER — Encounter: Payer: Self-pay | Admitting: Physician Assistant

## 2021-10-11 ENCOUNTER — Ambulatory Visit (INDEPENDENT_AMBULATORY_CARE_PROVIDER_SITE_OTHER): Payer: 59 | Admitting: Physician Assistant

## 2021-10-11 DIAGNOSIS — F331 Major depressive disorder, recurrent, moderate: Secondary | ICD-10-CM

## 2021-10-11 DIAGNOSIS — R5383 Other fatigue: Secondary | ICD-10-CM | POA: Diagnosis not present

## 2021-10-11 DIAGNOSIS — F411 Generalized anxiety disorder: Secondary | ICD-10-CM | POA: Diagnosis not present

## 2021-10-11 DIAGNOSIS — G4733 Obstructive sleep apnea (adult) (pediatric): Secondary | ICD-10-CM

## 2021-10-11 MED ORDER — BUPROPION HCL ER (XL) 150 MG PO TB24: 150.0000 mg | ORAL_TABLET | Freq: Every day | ORAL | 1 refills | Status: DC

## 2021-10-11 NOTE — Progress Notes (Signed)
[facility] ? ?[name],  ?MRN: [id number] ? ?PCP: Zoila Shutter, NP ? ?Date of Evaluation: 10/11/2021 ?Time spent:30 minutes ? ?Chief Complaint:  ?Chief Complaint   ?Anxiety; Depression; Follow-up ?  ? ? ? ?HISTORY/CURRENT STATUS: ?HPI For routine med check. ? ?Tired all the time. Modafinil helps a little. Sleeps a lot. Is trying to get out in sun some. It's a struggle. Doesn't enjoy things. Cancels plans, hard time remembering things. Appetite is ok, has gained wt. Grazes a lot, craves sweets. 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 Not missing work, pushes herself to do that, works 12 hour days, on days off, barely keeps the house clean. Isolates. Cries easily. No self harm. No SI/HI.  ? ?Patient denies increased energy with decreased need for sleep, no increased talkativeness, no racing thoughts, no impulsivity or risky behaviors, no increased spending, no increased libido, no grandiosity, no increased irritability or anger, no paranoia, and no hallucinations. ? ?Anxiety is stable. But does get sweaty and tense sometimes. Has palpitations occas but no more than nl. Has always had them. Takes klonopin at night, helps her sleep.  ? ?No cough or cold sx, CP, SOB, no n/v/c/d or urinary sx. No increased H/A, denies dizziness, syncope, seizures, numbness, tingling, tremor, tics, unsteady gait, slurred speech, confusion. Feels sore all over, almost all the time.  ? ?Individual Medical History/ Review of Systems: Changes? :No    ? ?Past medications for mental health diagnoses include: ?Lamictal, Abilify, Prozac, Zoloft, Celexa, Lexapro, Paxil, Cymbalta, Wellbutrin, lithium, Depakote, Seroquel, Risperdal, Zyprexa, gabapentin, Klonopin, Xanax, trazodone, BuSpar, Luvox, Ativan ? ?Allergies: Patient has no known allergies. ? ?Current Medications:  ?Current Outpatient Medications:  ?  b complex vitamins capsule, Take 1 capsule by mouth  daily., Disp: 30 capsule, Rfl: 11 ?  buPROPion (WELLBUTRIN XL) 150 MG 24 hr tablet, Take 1 tablet (150 mg total) by mouth daily., Disp: 30 tablet, Rfl: 1 ?  Cholecalciferol (VITAMIN D3) 50 MCG (2000 UT) capsule, Take 1 capsule (2,000 Units total) by mouth daily., Disp: 30 capsule, Rfl: 11 ?  clonazePAM (KLONOPIN) 0.5 MG tablet, Take 0.5-1 tablets (0.25-0.5 mg total) by mouth 2 times daily as needed for anxiety., Disp: 60 tablet, Rfl: 5 ?  Fish Oil-Cholecalciferol (OMEGA-3 + D) 500-200 MG-UNIT CAPS, Take 1 capsule by mouth daily., Disp: 30 capsule, Rfl: 11 ?  FLUoxetine (PROZAC) 40 MG capsule, Take 2 capsules (80 mg total) by mouth daily., Disp: 180 capsule, Rfl: 1 ?  gabapentin (NEURONTIN) 300 MG capsule, TAKE ONE CAPSULE BY MOUTH EVERY MORNING, and 2 capsules BY MOUTH EVERY evening 3-4 hours before bed., Disp: 270 capsule, Rfl: 0 ?  hydrOXYzine (ATARAX/VISTARIL) 10 MG tablet, Take 1-3 tablets (10-30 mg total) by mouth every 8 (eight) hours as needed., Disp: 90 tablet, Rfl: 1 ?  modafinil (PROVIGIL) 200 MG tablet, Take 0.5 tablets (100 mg total) by mouth daily as needed., Disp: 30 tablet, Rfl: 5 ?  Multiple Vitamins-Minerals (MULTIVITAMIN GUMMIES ADULT) CHEW, Chew 1 tablet by mouth daily., Disp: 30 tablet, Rfl: 11 ?  UNABLE TO FIND, CPAP, Disp: , Rfl:  ?  Erenumab-aooe (AIMOVIG) 140 MG/ML SOAJ, Inject once monthly. needs appointment for any further refills. (Patient not taking: Reported on 04/06/2021), Disp: 1 mL, Rfl: 4 ?  medroxyPROGESTERone (PROVERA) 10 MG tablet, Take 1 tablet (10 mg total) by mouth daily. (Patient not taking: Reported on 04/06/2021), Disp: 10 tablet, Rfl: 0 ?  propranolol ER (INDERAL LA) 80 MG 24 hr capsule, TAKE ONE CAPSULE BY MOUTH AT BEDTIME (Patient not taking: Reported on 10/05/2020), Disp: 180 capsule, Rfl: 6 ?  rizatriptan (MAXALT-MLT) 10 MG disintegrating tablet, Take 1 tablet (10 mg total) by mouth as needed for migraine. May repeat in 2 hours if needed (Patient not taking: Reported on  04/06/2021), Disp: 9 tablet, Rfl: 11 ?  Vitamin D, Ergocalciferol, (DRISDOL) 1.25 MG (50000 UNIT) CAPS capsule, Take 50,000 Units by mouth every 7 (seven) days. (Patient not taking: Reported on 04/06/2021), Disp: , Rfl:  ?Medication Side Effects: none ? ?Family Medical/ Social History: Changes? No ? ?MENTAL HEALTH EXAM: ? ?There were no vitals taken for this visit.There is no height or weight on file to calculate BMI.  ?General Appearance: Casual, Neat and Well Groomed  ?Eye Contact:  Good  ?Speech:  Clear and Coherent and Normal Rate  ?Volume:  Normal  ?Mood:  Depressed and Hopeless  ?Affect:  Depressed  ?Thought Process:  Goal Directed and Descriptions of Associations: Intact  ?Orientation:  Full (Time, Place, and Person)  ?Thought Content: Logical   ?Suicidal Thoughts:  No  ?Homicidal Thoughts:  No  ?Memory:  WNL  ?Judgement:  Good  ?Insight:  Good  ?Psychomotor Activity:  Normal  ?Concentration:  Concentration: Good  ?Recall:  Good  ?Fund of Knowledge: Good  ?Language: Good  ?Assets:  Desire for Improvement  ?ADL's:  Intact  ?Cognition: WNL  ?Prognosis:  Good  ? ? ?DIAGNOSES:  ?  ICD-10-CM   ?1. Major depressive disorder, recurrent episode, moderate (HCC)  F33.1   ?  ?2. Generalized anxiety disorder  F41.1   ?  ?3. Fatigue, unspecified type  R53.83   ?  ?4. Obstructive sleep apnea  G47.33   ?  ? ? ? ?Receiving Psychotherapy: No    ? ? ?RECOMMENDATIONS:  ?PDMP reviewed.  Last Klonopin filled 09/30/2021.  Modafinil filled 06/09/2021. ?I provided 30 minutes of face to face time during this encounter, including time spent before and after the visit in records review, medical decision making, counseling pertinent to today's visit, and charting.  ?We discussed her symptoms.  She has taken Wellbutrin in the past and thinks it may have helped.  Not sure when she took it or why it was stopped.  No history of seizures or eating disorder.  Benefits, risks, side effects were discussed and she would like to add the Wellbutrin  back again. ? ?Restart Wellbutrin XL 150 mg, 1 p.o. every morning. ?Continue Klonopin 0.5 mg, 1/2-1 po bid prn. ?Cont Prozac 40 mg, 2 qd. ?Continue gabapentin ?Continue hydroxyzine 10 mg, up to 3 p.o. 3 times daily as needed anxiety. ?Continue modafinil 200 mg, one half p.o. daily as needed. ?Return in 4 to 6 weeks. ? ?Donnal Moat, PA-C  ?

## 2021-10-14 ENCOUNTER — Other Ambulatory Visit: Payer: Self-pay | Admitting: Physician Assistant

## 2021-10-15 ENCOUNTER — Other Ambulatory Visit: Payer: Self-pay | Admitting: Physician Assistant

## 2021-10-15 NOTE — Telephone Encounter (Signed)
Last filled 4/20, due 5/19 ?

## 2021-10-27 ENCOUNTER — Other Ambulatory Visit: Payer: Self-pay | Admitting: Physician Assistant

## 2021-11-16 ENCOUNTER — Ambulatory Visit (INDEPENDENT_AMBULATORY_CARE_PROVIDER_SITE_OTHER): Payer: 59 | Admitting: Physician Assistant

## 2021-11-16 ENCOUNTER — Encounter: Payer: Self-pay | Admitting: Physician Assistant

## 2021-11-16 DIAGNOSIS — F331 Major depressive disorder, recurrent, moderate: Secondary | ICD-10-CM

## 2021-11-16 DIAGNOSIS — G4733 Obstructive sleep apnea (adult) (pediatric): Secondary | ICD-10-CM | POA: Diagnosis not present

## 2021-11-16 DIAGNOSIS — F411 Generalized anxiety disorder: Secondary | ICD-10-CM | POA: Diagnosis not present

## 2021-11-16 DIAGNOSIS — R5383 Other fatigue: Secondary | ICD-10-CM

## 2021-11-16 DIAGNOSIS — G4700 Insomnia, unspecified: Secondary | ICD-10-CM

## 2021-11-16 MED ORDER — MODAFINIL 200 MG PO TABS: 100.0000 mg | ORAL_TABLET | Freq: Every day | ORAL | 5 refills | Status: DC | PRN

## 2021-11-16 MED ORDER — BUPROPION HCL ER (XL) 300 MG PO TB24: 300.0000 mg | ORAL_TABLET | ORAL | 1 refills | Status: DC

## 2021-11-16 NOTE — Progress Notes (Signed)
[facility]  [name],  MRN: [id number]  PCP: Zoila Shutter, NP  Date of Evaluation: 11/16/2021 Time spent:20 minutes  Chief Complaint:  Chief Complaint   Anxiety; Depression; Follow-up     HISTORY/CURRENT STATUS: HPI For routine med check.  Is about 50% better since adding Wellbutrin back last month.  Has a little more energy and motivation.  The modafinil does help with fatigue.  Does not feel as sad or hopeless like she had been.  She is enjoying having a small garden, some vegetables and some flowers and herbs.  She likes doing that.  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  She is sleeping pretty well now.  Uses the CPAP.  Not having any symptoms of RLS.  ADLs and personal hygiene are normal.  Appetite is normal and weight is stable.  She is trying to take walks daily and eat a little healthier than she had been.  No suicidal or homicidal thoughts.  Anxiety is mostly well controlled.  She does take Klonopin when needed and it is effective.  She is not usually having panic attacks but more of a generalized sense of unease like something bad may happen.  Patient denies increased energy with decreased need for sleep, no increased talkativeness, no racing thoughts, no impulsivity or risky behaviors, no increased spending, no increased libido, no grandiosity, no increased irritability or anger, and no hallucinations.  Denies dizziness, syncope, seizures, numbness, tingling, tremor, tics, unsteady gait, slurred speech, confusion. Denies muscle or joint pain, stiffness, or dystonia.  Individual Medical History/ Review of Systems: Changes? :No     Past medications for mental health diagnoses include: Lamictal, Abilify, Prozac, Zoloft, Celexa, Lexapro, Paxil, Cymbalta, Wellbutrin, lithium, Depakote, Seroquel, Risperdal, Zyprexa, gabapentin, Klonopin, Xanax, trazodone, BuSpar, Luvox, Ativan  Allergies: Patient has no known  allergies.  Current Medications:  Current Outpatient Medications:    b complex vitamins capsule, Take 1 capsule by mouth daily., Disp: 30 capsule, Rfl: 11   buPROPion (WELLBUTRIN XL) 300 MG 24 hr tablet, Take 1 tablet (300 mg total) by mouth every morning., Disp: 30 tablet, Rfl: 1   Cholecalciferol (VITAMIN D3) 50 MCG (2000 UT) capsule, Take 1 capsule (2,000 Units total) by mouth daily., Disp: 30 capsule, Rfl: 11   clonazePAM (KLONOPIN) 0.5 MG tablet, Take 0.5-1 tablets (0.25-0.5 mg total) by mouth 2 times daily as needed for anxiety., Disp: 60 tablet, Rfl: 0   Fish Oil-Cholecalciferol (OMEGA-3 + D) 500-200 MG-UNIT CAPS, Take 1 capsule by mouth daily., Disp: 30 capsule, Rfl: 11   FLUoxetine (PROZAC) 40 MG capsule, Take 2 capsules (80 mg total) by mouth daily., Disp: 180 capsule, Rfl: 0   gabapentin (NEURONTIN) 300 MG capsule, TAKE ONE CAPSULE BY MOUTH EVERY MORNING, and 2 capsules BY MOUTH EVERY evening 3-4 hours before bed., Disp: 270 capsule, Rfl: 5   hydrOXYzine (ATARAX/VISTARIL) 10 MG tablet, Take 1-3 tablets (10-30 mg total) by mouth every 8 (eight) hours as needed., Disp: 90 tablet, Rfl: 1   Multiple Vitamins-Minerals (MULTIVITAMIN GUMMIES ADULT) CHEW, Chew 1 tablet by mouth daily., Disp: 30 tablet, Rfl: 11   UNABLE TO FIND, CPAP, Disp: , Rfl:    Erenumab-aooe (AIMOVIG) 140 MG/ML SOAJ, Inject once monthly. needs appointment for any further refills. (Patient not taking: Reported on 04/06/2021), Disp: 1 mL, Rfl: 4   medroxyPROGESTERone (PROVERA) 10 MG tablet, Take 1 tablet (10 mg total) by mouth daily. (Patient not taking: Reported on 04/06/2021), Disp:  10 tablet, Rfl: 0   modafinil (PROVIGIL) 200 MG tablet, Take 0.5 tablets (100 mg total) by mouth daily as needed., Disp: 30 tablet, Rfl: 5   propranolol ER (INDERAL LA) 80 MG 24 hr capsule, TAKE ONE CAPSULE BY MOUTH AT BEDTIME (Patient not taking: Reported on 10/05/2020), Disp: 180 capsule, Rfl: 6   rizatriptan (MAXALT-MLT) 10 MG disintegrating  tablet, Take 1 tablet (10 mg total) by mouth as needed for migraine. May repeat in 2 hours if needed (Patient not taking: Reported on 04/06/2021), Disp: 9 tablet, Rfl: 11   Vitamin D, Ergocalciferol, (DRISDOL) 1.25 MG (50000 UNIT) CAPS capsule, Take 50,000 Units by mouth every 7 (seven) days. (Patient not taking: Reported on 04/06/2021), Disp: , Rfl:  Medication Side Effects: none  Family Medical/ Social History: Changes? No  MENTAL HEALTH EXAM:  There were no vitals taken for this visit.There is no height or weight on file to calculate BMI.  General Appearance: Casual, Neat and Well Groomed  Eye Contact:  Good  Speech:  Clear and Coherent and Normal Rate  Volume:  Normal  Mood:  Euthymic  Affect:   appears sad still but brightens when discussing hobbies, work, Scientist, water quality Process:  Goal Directed and Descriptions of Associations: Intact  Orientation:  Full (Time, Place, and Person)  Thought Content: Logical   Suicidal Thoughts:  No  Homicidal Thoughts:  No  Memory:  WNL  Judgement:  Good  Insight:  Good  Psychomotor Activity:  Normal  Concentration:  Concentration: Good  Recall:  Good  Fund of Knowledge: Good  Language: Good  Assets:  Desire for Improvement  ADL's:  Intact  Cognition: WNL  Prognosis:  Good    DIAGNOSES:    ICD-10-CM   1. Major depressive disorder, recurrent episode, moderate (HCC)  F33.1     2. Obstructive sleep apnea  G47.33     3. Generalized anxiety disorder  F41.1     4. Fatigue, unspecified type  R53.83     5. Insomnia, unspecified type  G47.00       Receiving Psychotherapy: No      RECOMMENDATIONS:  PDMP reviewed.  Last Klonopin filled 10/29/2021.  Modafinil filled 06/09/2021. I provided 20 minutes of face to face time during this encounter, including time spent before and after the visit in records review, medical decision making, counseling pertinent to today's visit, and charting.  I am glad to see her doing better but I think we can  have even more improvement. Recommend increasing Wellbutrin.  She would like to try it.  Increase Wellbutrin XL to 300 mg, 1 p.o. every morning. Continue Klonopin 0.5 mg, 1/2-1 po bid prn. Cont Prozac 40 mg, 2 qd. Continue gabapentin 300 mg, 1 p.o. every morning and 2 p.o. nightly a few hours before bed. Continue hydroxyzine 10 mg, up to 3 p.o. 3 times daily as needed anxiety. Continue modafinil 200 mg, one half p.o. daily as needed. Continue multivitamin, vitamin D, B complex, fish oil. Return in 4 to 6 weeks.  Donnal Moat, PA-C

## 2021-11-24 ENCOUNTER — Other Ambulatory Visit: Payer: Self-pay | Admitting: Physician Assistant

## 2021-11-24 NOTE — Telephone Encounter (Signed)
Last filled 5/19, due 6/17

## 2021-11-25 ENCOUNTER — Telehealth: Payer: Self-pay

## 2021-11-25 NOTE — Telephone Encounter (Signed)
Prior Authorization submitted and approved for Modafinil 200 mg effective 11/25/2021-11/25/2022 with [facility] PA# [id number]

## 2021-12-22 ENCOUNTER — Other Ambulatory Visit: Payer: Self-pay | Admitting: Physician Assistant

## 2022-01-19 ENCOUNTER — Other Ambulatory Visit: Payer: Self-pay | Admitting: Physician Assistant

## 2022-01-20 NOTE — Telephone Encounter (Signed)
Last filled 7/14, due 8/12

## 2022-02-15 ENCOUNTER — Other Ambulatory Visit: Payer: Self-pay | Admitting: Physician Assistant

## 2022-02-18 NOTE — Telephone Encounter (Signed)
Please call patient to schedule an appt.-

## 2022-02-22 NOTE — Telephone Encounter (Signed)
Pt is scheduled for 10/03

## 2022-02-23 ENCOUNTER — Other Ambulatory Visit: Payer: Self-pay

## 2022-02-23 MED ORDER — CLONAZEPAM 0.5 MG PO TABS: ORAL_TABLET | ORAL | 0 refills | Status: DC

## 2022-03-15 ENCOUNTER — Ambulatory Visit (INDEPENDENT_AMBULATORY_CARE_PROVIDER_SITE_OTHER): Payer: Self-pay | Admitting: Physician Assistant

## 2022-03-15 ENCOUNTER — Other Ambulatory Visit: Payer: Self-pay | Admitting: Physician Assistant

## 2022-03-15 DIAGNOSIS — Z91199 Patient's noncompliance with other medical treatment and regimen due to unspecified reason: Secondary | ICD-10-CM

## 2022-03-15 NOTE — Telephone Encounter (Signed)
Please schedule appt

## 2022-03-15 NOTE — Progress Notes (Signed)
No show

## 2022-03-15 NOTE — Telephone Encounter (Signed)
She had an appointment today that she didn't come too

## 2022-03-17 ENCOUNTER — Telehealth: Payer: Self-pay | Admitting: Physician Assistant

## 2022-03-17 NOTE — Telephone Encounter (Signed)
Mailed warning letter from [facility]

## 2022-04-12 ENCOUNTER — Other Ambulatory Visit: Payer: Self-pay | Admitting: Physician Assistant

## 2022-04-12 NOTE — Telephone Encounter (Signed)
Filled 9/13

## 2022-04-18 ENCOUNTER — Ambulatory Visit: Payer: 59 | Admitting: Physician Assistant

## 2022-05-10 ENCOUNTER — Other Ambulatory Visit: Payer: Self-pay | Admitting: Physician Assistant

## 2022-05-10 NOTE — Telephone Encounter (Signed)
LVM to call office for follow up apt

## 2022-05-10 NOTE — Telephone Encounter (Signed)
Please schedule appt

## 2022-06-07 ENCOUNTER — Telehealth: Payer: Self-pay | Admitting: Physician Assistant

## 2022-06-09 NOTE — Telephone Encounter (Signed)
Please call to schedule an appt. Was a cancellation/no show for last 2 appts.

## 2022-06-23 NOTE — Telephone Encounter (Signed)
Has an appt 1/30

## 2022-07-05 ENCOUNTER — Other Ambulatory Visit: Payer: Self-pay | Admitting: Physician Assistant

## 2022-07-06 NOTE — Telephone Encounter (Signed)
Filled 11/1

## 2022-07-12 ENCOUNTER — Ambulatory Visit: Payer: 59 | Admitting: Physician Assistant

## 2022-08-01 ENCOUNTER — Ambulatory Visit (INDEPENDENT_AMBULATORY_CARE_PROVIDER_SITE_OTHER): Payer: 59 | Admitting: Physician Assistant

## 2022-08-01 ENCOUNTER — Encounter: Payer: Self-pay | Admitting: Physician Assistant

## 2022-08-01 DIAGNOSIS — G4733 Obstructive sleep apnea (adult) (pediatric): Secondary | ICD-10-CM | POA: Diagnosis not present

## 2022-08-01 DIAGNOSIS — F411 Generalized anxiety disorder: Secondary | ICD-10-CM

## 2022-08-01 DIAGNOSIS — F331 Major depressive disorder, recurrent, moderate: Secondary | ICD-10-CM | POA: Diagnosis not present

## 2022-08-01 MED ORDER — CLONAZEPAM 0.5 MG PO TABS: 0.5000 mg | ORAL_TABLET | Freq: Every day | ORAL | 1 refills | Status: DC

## 2022-08-01 MED ORDER — FLUOXETINE HCL 40 MG PO CAPS: 80.0000 mg | ORAL_CAPSULE | Freq: Every day | ORAL | 3 refills | Status: DC

## 2022-08-01 MED ORDER — HYDROXYZINE HCL 10 MG PO TABS: 10.0000 mg | ORAL_TABLET | Freq: Three times a day (TID) | ORAL | 1 refills | Status: AC | PRN

## 2022-08-01 MED ORDER — BUPROPION HCL ER (XL) 150 MG PO TB24: 150.0000 mg | ORAL_TABLET | Freq: Every day | ORAL | 0 refills | Status: DC

## 2022-08-01 NOTE — Progress Notes (Signed)
[facility]  [name],  MRN: [id number]  PCP: Zoila Shutter, NP  Date of Evaluation: 08/01/2022 Time spent:20 minutes  Chief Complaint:  Chief Complaint   Anxiety; Depression; Follow-up    HISTORY/CURRENT STATUS: HPI For routine med check.  Not sure how long she hasn't been taking the Wellbutrin, but couldn't remember it so she stopped it. Has noticed less energy and motivation. No missing days at work b/c of it, but has been out with covid most recently.  Work is going well, Merchandiser, retail. No extreme sadness, tearfulness, or feelings of hopelessness.  Sleeps well most of the time.  Uses her CPAP.  ADLs and personal hygiene are normal.  Has felt more 'scattered' since she's been off Wellbutrin. Takes Modafanil occas.  Appetite has not changed.  Weight is stable.  Denies laxative use, calorie restricting, or binging and purging.   Denies cutting or any form of self-harm.  Denies suicidal or homicidal thoughts.  Not having PA. Gets overwhelmed easily, worse in the evenings after work, she takes the [place] which helps.   Patient denies increased energy with decreased need for sleep, increased talkativeness, racing thoughts, impulsivity or risky behaviors, increased spending, increased libido, grandiosity, increased irritability or anger, paranoia, or hallucinations.  Denies dizziness, syncope, seizures, numbness, tingling, tremor, tics, unsteady gait, slurred speech, confusion. Denies muscle or joint pain, stiffness, or dystonia.  Individual Medical History/ Review of Systems: Changes? :Yes    had COVID last week  Past medications for mental health diagnoses include: Lamictal, Abilify, Prozac, Zoloft, Celexa, Lexapro, Paxil, Cymbalta, Wellbutrin, lithium, Depakote, Seroquel, Risperdal, Zyprexa, gabapentin, Klonopin, Xanax, trazodone, BuSpar, Luvox, Ativan  Allergies: Patient has no known allergies.  Current Medications:  Current Outpatient  Medications:    b complex vitamins capsule, Take 1 capsule by mouth daily., Disp: 30 capsule, Rfl: 11   buPROPion (WELLBUTRIN XL) 150 MG 24 hr tablet, Take 1 tablet (150 mg total) by mouth daily., Disp: 90 tablet, Rfl: 0   Cholecalciferol (VITAMIN D3) 50 MCG (2000 UT) capsule, Take 1 capsule (2,000 Units total) by mouth daily., Disp: 30 capsule, Rfl: 11   Fish Oil-Cholecalciferol (OMEGA-3 + D) 500-200 MG-UNIT CAPS, Take 1 capsule by mouth daily., Disp: 30 capsule, Rfl: 11   gabapentin (NEURONTIN) 300 MG capsule, TAKE ONE CAPSULE BY MOUTH EVERY MORNING, and 2 capsules BY MOUTH EVERY evening 3-4 hours before bed. (Patient taking differently: 300 mg. 3 capsules BY MOUTH EVERY evening 3-4 hours before bed.), Disp: 270 capsule, Rfl: 5   modafinil (PROVIGIL) 200 MG tablet, Take 0.5 tablets (100 mg total) by mouth daily as needed., Disp: 30 tablet, Rfl: 5   Multiple Vitamins-Minerals (MULTIVITAMIN GUMMIES ADULT) CHEW, Chew 1 tablet by mouth daily., Disp: 30 tablet, Rfl: 11   propranolol ER (INDERAL LA) 80 MG 24 hr capsule, TAKE ONE CAPSULE BY MOUTH AT BEDTIME, Disp: 180 capsule, Rfl: 6   UNABLE TO FIND, CPAP, Disp: , Rfl:    Vitamin D, Ergocalciferol, (DRISDOL) 1.25 MG (50000 UNIT) CAPS capsule, Take 50,000 Units by mouth every 7 (seven) days., Disp: , Rfl:    clonazePAM (KLONOPIN) 0.5 MG tablet, Take 1 tablet (0.5 mg total) by mouth at bedtime. prn, Disp: 30 tablet, Rfl: 1   Erenumab-aooe (AIMOVIG) 140 MG/ML SOAJ, Inject once monthly. needs appointment for any further refills. (Patient not taking: Reported on 04/06/2021), Disp: 1 mL, Rfl: 4   FLUoxetine (PROZAC) 40 MG capsule, Take 2 capsules (80 mg total) by mouth daily., Disp: 180  capsule, Rfl: 3   hydrOXYzine (ATARAX) 10 MG tablet, Take 1-3 tablets (10-30 mg total) by mouth every 8 (eight) hours as needed., Disp: 90 tablet, Rfl: 1   medroxyPROGESTERone (PROVERA) 10 MG tablet, Take 1 tablet (10 mg total) by mouth daily. (Patient not taking: Reported on  04/06/2021), Disp: 10 tablet, Rfl: 0   rizatriptan (MAXALT-MLT) 10 MG disintegrating tablet, Take 1 tablet (10 mg total) by mouth as needed for migraine. May repeat in 2 hours if needed (Patient not taking: Reported on 04/06/2021), Disp: 9 tablet, Rfl: 11 Medication Side Effects: none  Family Medical/ Social History: Changes? No  MENTAL HEALTH EXAM:  There were no vitals taken for this visit.There is no height or weight on file to calculate BMI.  General Appearance: Casual, Neat and Well Groomed  Eye Contact:  Good  Speech:  Clear and Coherent and Normal Rate  Volume:  Normal  Mood:  Euthymic  Affect:  Congruent  Thought Process:  Goal Directed and Descriptions of Associations: Intact  Orientation:  Full (Time, Place, and Person)  Thought Content: Logical   Suicidal Thoughts:  No  Homicidal Thoughts:  No  Memory:  WNL  Judgement:  Good  Insight:  Good  Psychomotor Activity:  Normal  Concentration:  Concentration: Good  Recall:  Good  Fund of Knowledge: Good  Language: Good  Assets:  Desire for Improvement  ADL's:  Intact  Cognition: WNL  Prognosis:  Good   DIAGNOSES:    ICD-10-CM   1. Major depressive disorder, recurrent episode, moderate (HCC)  F33.1     2. Generalized anxiety disorder  F41.1     3. Obstructive sleep apnea  G47.33       Receiving Psychotherapy: No     RECOMMENDATIONS:  PDMP reviewed.  Last Klonopin filled 07/06/2022.  Modafinil filled 06/09/2021. I provided 20 minutes of face to face time during this encounter, including time spent before and after the visit in records review, medical decision making, counseling pertinent to today's visit, and charting.   She is doing well for the most part except needs to get back on the Wellbutrin.  It was helping with energy and motivation, also focus.  Otherwise no changes.  Restart Wellbutrin XL, 150 mg ,1 p.o. every morning. Continue Klonopin 0.5 mg, 1/2-1 po bid prn. Cont Prozac 40 mg, 2 qd. Continue  gabapentin 300 mg, 3 p.o. nightly a few hours before bed. Continue hydroxyzine 10 mg, up to 3 p.o. 3 times daily as needed anxiety. Continue modafinil 200 mg, one half p.o. daily as needed.(Rarely takes) Continue multivitamin, vitamin D, B complex, fish oil. Return in 6 weeks.  Donnal Moat, PA-C

## 2022-08-02 ENCOUNTER — Other Ambulatory Visit: Payer: Self-pay | Admitting: Physician Assistant

## 2022-08-31 ENCOUNTER — Other Ambulatory Visit: Payer: Self-pay | Admitting: Physician Assistant

## 2022-09-15 ENCOUNTER — Other Ambulatory Visit: Payer: Self-pay | Admitting: Physician Assistant

## 2022-09-19 ENCOUNTER — Ambulatory Visit (INDEPENDENT_AMBULATORY_CARE_PROVIDER_SITE_OTHER): Payer: 59 | Admitting: Physician Assistant

## 2022-09-19 ENCOUNTER — Encounter: Payer: Self-pay | Admitting: Physician Assistant

## 2022-09-19 DIAGNOSIS — F411 Generalized anxiety disorder: Secondary | ICD-10-CM

## 2022-09-19 DIAGNOSIS — G4733 Obstructive sleep apnea (adult) (pediatric): Secondary | ICD-10-CM | POA: Diagnosis not present

## 2022-09-19 DIAGNOSIS — F3341 Major depressive disorder, recurrent, in partial remission: Secondary | ICD-10-CM | POA: Diagnosis not present

## 2022-09-19 MED ORDER — MODAFINIL 200 MG PO TABS: 200.0000 mg | ORAL_TABLET | Freq: Every day | ORAL | 5 refills | Status: DC | PRN

## 2022-09-19 MED ORDER — BUPROPION HCL ER (XL) 300 MG PO TB24: 300.0000 mg | ORAL_TABLET | Freq: Every morning | ORAL | 3 refills | Status: DC

## 2022-09-19 NOTE — Progress Notes (Signed)
[facility]  [name],  MRN: [id number]  PCP: Hortencia Conradi, NP  Date of Evaluation: 09/19/2022 Time spent:20 minutes  Chief Complaint:  Chief Complaint   Anxiety; Depression; Insomnia; Follow-up     HISTORY/CURRENT STATUS: HPI For routine med check.  At her appointment 6 weeks ago we restarted Wellbutrin.  States she is feeling better, has a little more energy and motivation.  She is maybe 25 to 50% better.  She takes modafinil which helps "wake her up."  Only taking it as needed, on her work days.  She works 12-hour shifts, 3 days a week. Patient is able to enjoy things.  Went to [employer] last week on spring break and had a good time.  No extreme sadness, tearfulness, or feelings of hopelessness.  Sleeps well most of the time.  Does use her CPAP religiously.  ADLs and personal hygiene are normal.   Denies any changes in concentration, making decisions, or remembering things.  Appetite has not changed.  Weight is stable.  Denies suicidal or homicidal thoughts.  States anxiety is well controlled.  "It is manageable".  She uses the Klonopin pretty rarely.  Not having panic attacks.  She sometimes gets overwhelmed, more with work than anything.  Patient denies increased energy with decreased need for sleep, increased talkativeness, racing thoughts, impulsivity or risky behaviors, increased spending, increased libido, grandiosity, increased irritability or anger, paranoia, or hallucinations.  Denies dizziness, syncope, seizures, numbness, tingling, tremor, tics, unsteady gait, slurred speech, confusion. Denies muscle or joint pain, stiffness, or dystonia.  Individual Medical History/ Review of Systems: Changes? :No     Past medications for mental health diagnoses include: Lamictal, Abilify, Prozac, Zoloft, Celexa, Lexapro, Paxil, Cymbalta, Wellbutrin, lithium, Depakote, Seroquel, Risperdal, Zyprexa, gabapentin, Klonopin, Xanax, trazodone, BuSpar,  Luvox, Ativan  Allergies: Patient has no known allergies.  Current Medications:  Current Outpatient Medications:    b complex vitamins capsule, Take 1 capsule by mouth daily., Disp: 30 capsule, Rfl: 11   buPROPion (WELLBUTRIN XL) 300 MG 24 hr tablet, Take 1 tablet (300 mg total) by mouth in the morning., Disp: 90 tablet, Rfl: 3   Cholecalciferol (VITAMIN D3) 50 MCG (2000 UT) capsule, Take 1 capsule (2,000 Units total) by mouth daily., Disp: 30 capsule, Rfl: 11   clonazePAM (KLONOPIN) 0.5 MG tablet, Take 0.5-1 tablets (0.25-0.5 mg total) by mouth 2 times daily as needed for anxiety., Disp: 20 tablet, Rfl: 0   Fish Oil-Cholecalciferol (OMEGA-3 + D) 500-200 MG-UNIT CAPS, Take 1 capsule by mouth daily., Disp: 30 capsule, Rfl: 11   FLUoxetine (PROZAC) 40 MG capsule, Take 2 capsules (80 mg total) by mouth daily., Disp: 180 capsule, Rfl: 3   gabapentin (NEURONTIN) 300 MG capsule, TAKE ONE CAPSULE BY MOUTH EVERY MORNING, and 2 capsules BY MOUTH EVERY evening 3-4 hours before bed. (Patient taking differently: 300 mg. 3 capsules BY MOUTH EVERY evening 3-4 hours before bed.), Disp: 270 capsule, Rfl: 5   hydrOXYzine (ATARAX) 10 MG tablet, Take 1-3 tablets (10-30 mg total) by mouth every 8 (eight) hours as needed., Disp: 90 tablet, Rfl: 1   Multiple Vitamins-Minerals (MULTIVITAMIN GUMMIES ADULT) CHEW, Chew 1 tablet by mouth daily., Disp: 30 tablet, Rfl: 11   rosuvastatin (CRESTOR) 5 MG tablet, Take 5 mg by mouth at bedtime., Disp: , Rfl:    UNABLE TO FIND, CPAP, Disp: , Rfl:    Vitamin D, Ergocalciferol, (DRISDOL) 1.25 MG (50000 UNIT) CAPS capsule, Take 50,000 Units by mouth every 7 (seven)  days., Disp: , Rfl:    Erenumab-aooe (AIMOVIG) 140 MG/ML SOAJ, Inject once monthly. needs appointment for any further refills. (Patient not taking: Reported on 04/06/2021), Disp: 1 mL, Rfl: 4   medroxyPROGESTERone (PROVERA) 10 MG tablet, Take 1 tablet (10 mg total) by mouth daily. (Patient not taking: Reported on  04/06/2021), Disp: 10 tablet, Rfl: 0   modafinil (PROVIGIL) 200 MG tablet, Take 1 tablet (200 mg total) by mouth daily as needed., Disp: 30 tablet, Rfl: 5   propranolol ER (INDERAL LA) 80 MG 24 hr capsule, TAKE ONE CAPSULE BY MOUTH AT BEDTIME (Patient not taking: Reported on 09/19/2022), Disp: 180 capsule, Rfl: 6   rizatriptan (MAXALT-MLT) 10 MG disintegrating tablet, Take 1 tablet (10 mg total) by mouth as needed for migraine. May repeat in 2 hours if needed (Patient not taking: Reported on 04/06/2021), Disp: 9 tablet, Rfl: 11 Medication Side Effects: none  Family Medical/ Social History: Changes? No  MENTAL HEALTH EXAM:  There were no vitals taken for this visit.There is no height or weight on file to calculate BMI.  General Appearance: Casual and Well Groomed  Eye Contact:  Good  Speech:  Clear and Coherent and Normal Rate  Volume:  Normal  Mood:  Euthymic  Affect:  Congruent  Thought Process:  Goal Directed and Descriptions of Associations: Intact  Orientation:  Full (Time, Place, and Person)  Thought Content: Logical   Suicidal Thoughts:  No  Homicidal Thoughts:  No  Memory:  WNL  Judgement:  Good  Insight:  Good  Psychomotor Activity:  Normal  Concentration:  Concentration: Good and Attention Span: Good  Recall:  Good  Fund of Knowledge: Good  Language: Good  Assets:  Desire for Improvement  ADL's:  Intact  Cognition: WNL  Prognosis:  Good   DIAGNOSES:    ICD-10-CM   1. Depression, major, recurrent, in partial remission  F33.41     2. Generalized anxiety disorder  F41.1     3. Obstructive sleep apnea  G47.33       Receiving Psychotherapy: No     RECOMMENDATIONS:  PDMP reviewed.  Last Klonopin filled 08/31/2022.  Gabapentin filled 08/31/2022.   I provided 20 minutes of face to face time during this encounter, including time spent before and after the visit in records review, medical decision making, counseling pertinent to today's visit, and charting.   I am glad  to hear that she is doing better.  But I think increasing the Wellbutrin would be more helpful.  She would like to go back up.  Increase Wellbutrin XL to 300 mg.  Mg ,1 p.o. every morning. Continue Klonopin 0.5 mg, 1/2-1 po bid prn. Cont Prozac 40 mg, 2 qd. Continue gabapentin 300 mg, 3 p.o. nightly a few hours before bed. Continue hydroxyzine 10 mg, up to 3 p.o. 3 times daily as needed anxiety. Continue modafinil 200 mg, one half p.o. daily as needed.(Rarely takes) Continue multivitamin, vitamin D, B complex, fish oil. Continue CPAP use. Return in 3 months.  Melony Overly, PA-C

## 2022-10-25 ENCOUNTER — Other Ambulatory Visit: Payer: Self-pay | Admitting: Physician Assistant

## 2022-11-22 ENCOUNTER — Other Ambulatory Visit: Payer: Self-pay | Admitting: Physician Assistant

## 2022-11-23 NOTE — Telephone Encounter (Signed)
Next apt 7/08 Last refill 5/17

## 2022-12-19 ENCOUNTER — Ambulatory Visit (INDEPENDENT_AMBULATORY_CARE_PROVIDER_SITE_OTHER): Payer: 59 | Admitting: Physician Assistant

## 2022-12-21 ENCOUNTER — Other Ambulatory Visit: Payer: Self-pay | Admitting: Physician Assistant

## 2023-01-18 ENCOUNTER — Other Ambulatory Visit: Payer: Self-pay | Admitting: Physician Assistant

## 2023-01-18 NOTE — Telephone Encounter (Signed)
Please call to schedule appt, was due last month.  

## 2023-01-19 NOTE — Telephone Encounter (Signed)
Lvm for pt to call and schedule 

## 2023-02-16 ENCOUNTER — Other Ambulatory Visit: Payer: Self-pay | Admitting: Physician Assistant

## 2023-03-06 ENCOUNTER — Ambulatory Visit: Payer: 59 | Admitting: Physician Assistant

## 2023-03-20 ENCOUNTER — Ambulatory Visit: Payer: 59 | Admitting: Physician Assistant

## 2023-04-04 ENCOUNTER — Encounter: Payer: Self-pay | Admitting: Physician Assistant

## 2023-04-04 ENCOUNTER — Ambulatory Visit (INDEPENDENT_AMBULATORY_CARE_PROVIDER_SITE_OTHER): Payer: 59 | Admitting: Physician Assistant

## 2023-04-04 DIAGNOSIS — G4733 Obstructive sleep apnea (adult) (pediatric): Secondary | ICD-10-CM | POA: Diagnosis not present

## 2023-04-04 DIAGNOSIS — F411 Generalized anxiety disorder: Secondary | ICD-10-CM | POA: Diagnosis not present

## 2023-04-04 DIAGNOSIS — F3341 Major depressive disorder, recurrent, in partial remission: Secondary | ICD-10-CM

## 2023-04-04 DIAGNOSIS — G4700 Insomnia, unspecified: Secondary | ICD-10-CM | POA: Diagnosis not present

## 2023-04-04 MED ORDER — CLONAZEPAM 0.5 MG PO TABS: 0.5000 mg | ORAL_TABLET | Freq: Two times a day (BID) | ORAL | 5 refills | Status: DC | PRN

## 2023-04-04 MED ORDER — MODAFINIL 200 MG PO TABS: 200.0000 mg | ORAL_TABLET | Freq: Every day | ORAL | 5 refills | Status: DC | PRN

## 2023-04-04 NOTE — Progress Notes (Signed)
[facility]  [name],  MRN: [id number]  PCP: Amanda Conradi, NP  Date of Evaluation: 04/04/2023 Time spent: 22 minutes  Chief Complaint:  Chief Complaint   Anxiety; Depression; Follow-up    HISTORY/CURRENT STATUS: HPI For routine med check.  Dtr has been sick, in and out of the hospital. She has Crohns. Had several surgeries but is doing well now. Is 47 yo. So that's been stressful for Amanda Marks but she feels like her medications are helping with the stress.  She does have to take the Klonopin, not always twice daily but she is overwhelmed.  Feels like it is a normal response to what is going on though.    She is sleeping well most of the time and feels rested when she gets up.  She uses her CPAP.  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  She is thinking about starting to work part-time.  She is praying about what to do.  Patient is able to enjoy things.  Energy and motivation are good.  No extreme sadness, tearfulness, or feelings of hopelessness.  ADLs and personal hygiene are normal.   Denies any changes in concentration, making decisions, or remembering things.  Appetite has not changed.  Weight is stable.  Denies suicidal or homicidal thoughts.  Patient denies increased energy with decreased need for sleep, increased talkativeness, racing thoughts, impulsivity or risky behaviors, increased spending, increased libido, grandiosity, increased irritability or anger, paranoia, or hallucinations.  Denies dizziness, syncope, seizures, numbness, tingling, tremor, tics, unsteady gait, slurred speech, confusion. Denies muscle or joint pain, stiffness, or dystonia.  Individual Medical History/ Review of Systems: Changes? :No     Past medications for mental health diagnoses include: Lamictal, Abilify, Prozac, Zoloft, Celexa, Lexapro, Paxil, Cymbalta, Wellbutrin, lithium, Depakote, Seroquel, Risperdal, Zyprexa,  gabapentin, Klonopin, Xanax, trazodone, BuSpar, Luvox, Ativan  Allergies: Patient has no known allergies.  Current Medications:  Current Outpatient Medications:    b complex vitamins capsule, Take 1 capsule by mouth daily., Disp: 30 capsule, Rfl: 11   buPROPion (WELLBUTRIN XL) 300 MG 24 hr tablet, Take 1 tablet (300 mg total) by mouth in the morning., Disp: 90 tablet, Rfl: 3   Cholecalciferol (VITAMIN D3) 50 MCG (2000 UT) capsule, Take 1 capsule (2,000 Units total) by mouth daily., Disp: 30 capsule, Rfl: 11   Fish Oil-Cholecalciferol (OMEGA-3 + D) 500-200 MG-UNIT CAPS, Take 1 capsule by mouth daily., Disp: 30 capsule, Rfl: 11   FLUoxetine (PROZAC) 40 MG capsule, Take 2 capsules (80 mg total) by mouth daily., Disp: 180 capsule, Rfl: 3   gabapentin (NEURONTIN) 300 MG capsule, Take 3 capsules BY MOUTH EVERY evening 3-4 hours before bed., Disp: 270 capsule, Rfl: 1   hydrOXYzine (ATARAX) 10 MG tablet, Take 1-3 tablets (10-30 mg total) by mouth every 8 (eight) hours as needed., Disp: 90 tablet, Rfl: 1   Multiple Vitamins-Minerals (MULTIVITAMIN GUMMIES ADULT) CHEW, Chew 1 tablet by mouth daily., Disp: 30 tablet, Rfl: 11   rosuvastatin (CRESTOR) 5 MG tablet, Take 5 mg by mouth at bedtime., Disp: , Rfl:    UNABLE TO FIND, CPAP, Disp: , Rfl:    Vitamin D, Ergocalciferol, (DRISDOL) 1.25 MG (50000 UNIT) CAPS capsule, Take 50,000 Units by mouth every 7 (seven) days., Disp: , Rfl:    clonazePAM (KLONOPIN) 0.5 MG tablet, Take 1 tablet (0.5 mg total) by mouth 2 (two) times daily as needed for anxiety., Disp: 60 tablet, Rfl: 5  Erenumab-aooe (AIMOVIG) 140 MG/ML SOAJ, Inject once monthly. needs appointment for any further refills. (Patient not taking: Reported on 04/06/2021), Disp: 1 mL, Rfl: 4   medroxyPROGESTERone (PROVERA) 10 MG tablet, Take 1 tablet (10 mg total) by mouth daily. (Patient not taking: Reported on 04/06/2021), Disp: 10 tablet, Rfl: 0   modafinil (PROVIGIL) 200 MG tablet, Take 1 tablet (200 mg  total) by mouth daily as needed., Disp: 30 tablet, Rfl: 5   propranolol ER (INDERAL LA) 80 MG 24 hr capsule, TAKE ONE CAPSULE BY MOUTH AT BEDTIME (Patient not taking: Reported on 09/19/2022), Disp: 180 capsule, Rfl: 6   rizatriptan (MAXALT-MLT) 10 MG disintegrating tablet, Take 1 tablet (10 mg total) by mouth as needed for migraine. May repeat in 2 hours if needed (Patient not taking: Reported on 04/06/2021), Disp: 9 tablet, Rfl: 11 Medication Side Effects: none  Family Medical/ Social History: Changes? No  MENTAL HEALTH EXAM:  There were no vitals taken for this visit.There is no height or weight on file to calculate BMI.  General Appearance: Casual and Well Groomed  Eye Contact:  Good  Speech:  Clear and Coherent and Normal Rate  Volume:  Normal  Mood:  Euthymic  Affect:  Congruent  Thought Process:  Goal Directed and Descriptions of Associations: Intact  Orientation:  Full (Time, Place, and Person)  Thought Content: Logical   Suicidal Thoughts:  No  Homicidal Thoughts:  No  Memory:  WNL  Judgement:  Good  Insight:  Good  Psychomotor Activity:  Normal  Concentration:  Concentration: Good and Attention Span: Good  Recall:  Good  Fund of Knowledge: Good  Language: Good  Assets:  Desire for Improvement Financial Resources/Insurance Housing Transportation Vocational/Educational  ADL's:  Intact  Cognition: WNL  Prognosis:  Good   DIAGNOSES:    ICD-10-CM   1. Depression, major, recurrent, in partial remission (HCC)  F33.41     2. Generalized anxiety disorder  F41.1     3. Insomnia, unspecified type  G47.00     4. Obstructive sleep apnea  G47.33      Receiving Psychotherapy: No     RECOMMENDATIONS:  PDMP reviewed.  Last Klonopin filled 12/21/2022.  Gabapentin filled 01/18/2023. I provided 22 minutes of face to face time during this encounter, including time spent before and after the visit in records review, medical decision making, counseling pertinent to today's visit,  and charting.   Overall she is doing well so no changes will be made.  Continue Wellbutrin XL 300 mg,1 p.o. every morning. Continue Klonopin 0.5 mg, 1/2-1 po bid prn. Cont Prozac 40 mg, 2 qd. Continue gabapentin 300 mg, 3 p.o. nightly a few hours before bed. Continue hydroxyzine 10 mg, up to 3 p.o. 3 times daily as needed anxiety. Continue modafinil 200 mg, one half p.o. daily as needed.(Rarely takes) Continue multivitamin, vitamin D, B complex, fish oil. Continue CPAP use. Return in 6 months.  Melony Overly, PA-C

## 2023-04-10 ENCOUNTER — Telehealth: Payer: Self-pay

## 2023-04-11 NOTE — Telephone Encounter (Signed)
Prior Authorization Modafinil 200 mg #[id number]  Approved Effective: Start Date:04/10/2023;Coverage End Date:04/09/2024

## 2023-08-08 ENCOUNTER — Other Ambulatory Visit (INDEPENDENT_AMBULATORY_CARE_PROVIDER_SITE_OTHER): Payer: Self-pay

## 2023-08-08 ENCOUNTER — Ambulatory Visit: Payer: PRIVATE HEALTH INSURANCE | Admitting: Orthopaedic Surgery

## 2023-08-08 ENCOUNTER — Encounter: Payer: Self-pay | Admitting: Orthopaedic Surgery

## 2023-08-08 VITALS — BP 167/96 | HR 79 | Ht 62.0 in | Wt 221.0 lb

## 2023-08-08 DIAGNOSIS — M25511 Pain in right shoulder: Secondary | ICD-10-CM | POA: Diagnosis not present

## 2023-08-08 DIAGNOSIS — M5126 Other intervertebral disc displacement, lumbar region: Secondary | ICD-10-CM | POA: Insufficient documentation

## 2023-08-08 NOTE — Addendum Note (Signed)
 Addended by: Rogers Seeds on: 08/08/2023 03:18 PM   Modules accepted: Orders

## 2023-08-08 NOTE — Progress Notes (Signed)
 Office Visit Note   Patient: Amanda Marks           Date of Birth: 24-Oct-1975           MRN: [id number] Visit Date: 08/08/2023              Requested by: Hortencia Conradi, NP [address] [place],  [place] [address] PCP: Hortencia Conradi, NP   Assessment & Plan: Visit Diagnoses:  1. Acute pain of right shoulder   2. Protrusion of lumbar intervertebral disc     Plan: Will set her up for physical therapy of her shoulder as well as lumbar spine at [facility] outpatient.  Evaluation and treatment of shoulder impingement and L2-3 disc protrusion.  Recheck 4 weeks.  Follow-Up Instructions: Return in about 4 weeks (around 09/05/2023).   Orders:  Orders Placed This Encounter  Procedures   XR Shoulder Right   No orders of the defined types were placed in this encounter.     Procedures: No procedures performed   Clinical Data: No additional findings.   Subjective: Chief Complaint  Patient presents with   Right Shoulder - Pain   Lower Back - Pain    HPI 48 year old female seen with 2 problems 1 is right shoulder pain which has bothered her for a few weeks with outstretched reaching and lifting.  No numbness or tingling in her fingers she is right-hand dominant.  Her other problem is been low back pain which has been present for several months.  Her PCP ordered an MRI at [facility] which for some reason report and images are not available on canopy.  Pain shoots into her thighs but not down to her feet.  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  No problems with standing she does some sitting and charting.  No associated bowel or bladder symptoms.  Review of Systems positive history of major depression as well as migraines all systems noncontributory HPI.   Objective: Vital Signs: BP (!) 167/96   Pulse 79   Ht 5\' 2"  (1.575 m)   Wt 221 lb (100.2 kg)   BMI 40.42 kg/m   Physical  Exam Constitutional:      Appearance: She is well-developed.  HENT:     Head: Normocephalic.     Right Ear: External ear normal.     Left Ear: External ear normal. There is no impacted cerumen.  Eyes:     Pupils: Pupils are equal, round, and reactive to light.  Neck:     Thyroid: No thyromegaly.     Trachea: No tracheal deviation.  Cardiovascular:     Rate and Rhythm: Normal rate.  Pulmonary:     Effort: Pulmonary effort is normal.  Abdominal:     Palpations: Abdomen is soft.  Musculoskeletal:     Cervical back: No rigidity.  Skin:    General: Skin is warm and dry.  Neurological:     Mental Status: She is alert and oriented to person, place, and time.  Psychiatric:        Behavior: Behavior normal.     Ortho Exam negative drop arm test.  System tenderness lumbosacral junction she is able to toe walk has some difficulty toe walking on the left.  She heel walks without balance problems.  No lower extremity clonus.  Knees reach full extension.  No atrophy of the lower extremities.  Specialty Comments:  No specialty comments available.  Imaging:  Patient's MRI report is on her phone which was reviewed that showed some disc degeneration lumbar spine (most prominent at L2-3 where there was some central protrusion that touches the thecal sac without compression.   PMFS History: Patient Active Problem List   Diagnosis Date Noted   Protrusion of lumbar intervertebral disc 08/08/2023   Chronic migraine without aura without status migrainosus, not intractable 10/08/2018   Major depression, recurrent, chronic (HCC) 04/03/2018   Major depressive disorder, single episode, severe (HCC) 04/02/2018   Past Medical History:  Diagnosis Date   Anemia 2021   Anxiety disorder, unspecified    Chronic fatigue, unspecified    Depression    Essential (primary) hypertension    History of kidney stones    Kidney stones    Migraines    Sleep apnea, unspecified    C-Pap   Vitamin D  deficiency, unspecified     Family History  Problem Relation Age of Onset   Hypertension Mother    Hypertension Father    Diabetes Father    Migraines Neg Hx     Past Surgical History:  Procedure Laterality Date   CHOLECYSTECTOMY     CYSTOSCOPY/URETEROSCOPY/HOLMIUM LASER/STENT PLACEMENT Right 01/22/2021   Procedure: CYSTOSCOPY/RETROGRADE/URETEROSCOPY/HOLMIUM LASER/STENT PLACEMENT;  Surgeon: Jerilee Field, MD;  Location: [facility];  Service: Urology;  Laterality: Right;  ONLY NEEDS 60 MIN   STENT PLACE LEFT URETER ([facility] HX)     TUBAL LIGATION     UMBILICAL HERNIA REPAIR     2015 & 2017   Social History   Occupational History   Not on file  Tobacco Use   Smoking status: Former    Current packs/day: 0.00    Types: Cigarettes    Start date: 63    Quit date: 1998    Years since quitting: 27.1   Smokeless tobacco: Never  Vaping Use   Vaping status: Never Used  Substance and Sexual Activity   Alcohol use: No   Drug use: Never   Sexual activity: Yes    Birth control/protection: None

## 2023-10-03 ENCOUNTER — Ambulatory Visit (INDEPENDENT_AMBULATORY_CARE_PROVIDER_SITE_OTHER): Payer: 59 | Admitting: Physician Assistant

## 2023-10-11 ENCOUNTER — Encounter (INDEPENDENT_AMBULATORY_CARE_PROVIDER_SITE_OTHER): Payer: Self-pay | Admitting: Otolaryngology

## 2023-10-19 ENCOUNTER — Encounter (INDEPENDENT_AMBULATORY_CARE_PROVIDER_SITE_OTHER): Payer: Self-pay

## 2023-11-20 ENCOUNTER — Ambulatory Visit (INDEPENDENT_AMBULATORY_CARE_PROVIDER_SITE_OTHER): Payer: PRIVATE HEALTH INSURANCE | Admitting: Physician Assistant

## 2023-11-20 ENCOUNTER — Encounter: Payer: Self-pay | Admitting: Physician Assistant

## 2023-11-20 ENCOUNTER — Ambulatory Visit (INDEPENDENT_AMBULATORY_CARE_PROVIDER_SITE_OTHER): Payer: PRIVATE HEALTH INSURANCE | Admitting: Audiology

## 2023-11-20 ENCOUNTER — Encounter (INDEPENDENT_AMBULATORY_CARE_PROVIDER_SITE_OTHER): Payer: Self-pay | Admitting: Physician Assistant

## 2023-11-20 VITALS — BP 142/91 | HR 71 | Ht 62.0 in | Wt 220.0 lb

## 2023-11-20 DIAGNOSIS — F3341 Major depressive disorder, recurrent, in partial remission: Secondary | ICD-10-CM

## 2023-11-20 DIAGNOSIS — F411 Generalized anxiety disorder: Secondary | ICD-10-CM | POA: Diagnosis not present

## 2023-11-20 DIAGNOSIS — R5383 Other fatigue: Secondary | ICD-10-CM

## 2023-11-20 DIAGNOSIS — G4733 Obstructive sleep apnea (adult) (pediatric): Secondary | ICD-10-CM | POA: Diagnosis not present

## 2023-11-20 DIAGNOSIS — R42 Dizziness and giddiness: Secondary | ICD-10-CM

## 2023-11-20 DIAGNOSIS — R5381 Other malaise: Secondary | ICD-10-CM

## 2023-11-20 DIAGNOSIS — Z79899 Other long term (current) drug therapy: Secondary | ICD-10-CM

## 2023-11-20 DIAGNOSIS — H903 Sensorineural hearing loss, bilateral: Secondary | ICD-10-CM

## 2023-11-20 MED ORDER — FLUOXETINE HCL 40 MG PO CAPS: 80.0000 mg | ORAL_CAPSULE | Freq: Every day | ORAL | 1 refills | Status: DC

## 2023-11-20 MED ORDER — GABAPENTIN 300 MG PO CAPS: ORAL_CAPSULE | ORAL | 1 refills | Status: DC

## 2023-11-20 MED ORDER — CLONAZEPAM 0.5 MG PO TABS: 0.5000 mg | ORAL_TABLET | Freq: Two times a day (BID) | ORAL | 5 refills | Status: DC | PRN

## 2023-11-20 MED ORDER — BUPROPION HCL ER (XL) 300 MG PO TB24: 300.0000 mg | ORAL_TABLET | Freq: Every morning | ORAL | 1 refills | Status: DC

## 2023-11-20 MED ORDER — MODAFINIL 200 MG PO TABS: 200.0000 mg | ORAL_TABLET | Freq: Every day | ORAL | 5 refills | Status: DC | PRN

## 2023-11-20 NOTE — Progress Notes (Unsigned)
 Dear Dr. Caretha Chapel, Here is my assessment for our mutual patient, Amanda Marks. Thank you for allowing me the opportunity to care for your patient. Please do not hesitate to contact me should you have any other questions. Sincerely, Belma Boxer PA-C  Otolaryngology Clinic Note Referring provider: Dr. Caretha Chapel HPI:  Amanda Marks is a 48 y.o. female kindly referred by Dr. Caretha Chapel   This a 48 year old female seen in our office for evaluation of hearing loss.  Patient notes over the last several years she has had hearing loss in both ears, she notes this is symmetric.  She notes sometimes she feels fullness in the ears, she denies any pain.  She notes occasional high-pitched ringing in both ears.  She denies any preceding trauma to the ear no history recurrent ear infections.  Additionally she does note that she gets dizzy and has for many years.  She notes the dizziness is worse when she is stressed, she does feel some spinning sensation.  She notes she also has migraines and the dizziness often accompanies the migraines.  She also notes she has some dizziness when she does not have migraines.  Denies any associated neurologic symptoms.  She notes that laying down and resting makes it better, she has not tried treating the migraines.  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  He notes a history of seasonal allergies but does not take any medications for it.     Independent Review of Additional Tests or Records:  Audiological evaluation on 11/20/2023 Otoscopy: Right ear: Clear external ear canal and notable landmarks visualized on the tympanic membrane. Left ear:  Clear external ear canal and notable landmarks visualized on the tympanic membrane.   Tympanometry: Right ear: Type A- Normal external ear canal volume with normal middle ear pressure and tympanic membrane compliance. Left ear: Type A- Normal external ear canal volume with normal middle ear pressure  and tympanic membrane compliance.   Pure tone Audiometry: Right ear- Normal to mild sensorineural hearing loss from 125 Hz - 8000 Hz, with a cookie bite configuration. Left ear-  Normal to mild sensorineural hearing loss from 125 Hz - 8000 Hz, with a cookie bite configuration.   Speech Audiometry: Right ear- Speech Reception Threshold (SRT) was obtained at 25 dBHL. Left ear-Speech Reception Threshold (SRT) was obtained at 25 dBHL.   Word Recognition Score Tested using NU-6 (recorded) Right ear: 100% was obtained at a presentation level of 65 dBHL with contralateral masking which is deemed as  excellent. Left ear: 100% was obtained at a presentation level of 65 dBHL with contralateral masking which is deemed as  excellent.   The hearing test results were completed under headphones and re-checked with inserts and results are deemed to be of good reliability. Test technique:  conventional      PMH/Meds/All/SocHx/FamHx/ROS:   Past Medical History:  Diagnosis Date   Anemia 2021   Anxiety disorder, unspecified    Chronic fatigue, unspecified    Depression    Essential (primary) hypertension    History of kidney stones    Kidney stones    Migraines    Sleep apnea, unspecified    C-Pap   Vitamin D deficiency, unspecified      Past Surgical History:  Procedure Laterality Date   CHOLECYSTECTOMY     CYSTOSCOPY/URETEROSCOPY/HOLMIUM LASER/STENT PLACEMENT Right 01/22/2021   Procedure: CYSTOSCOPY/RETROGRADE/URETEROSCOPY/HOLMIUM LASER/STENT PLACEMENT;  Surgeon: Christina Coyer, MD;  Location: [facility];  Service: Urology;  Laterality: Right;  ONLY NEEDS 60 MIN   STENT PLACE LEFT URETER ([facility] HX)     TUBAL LIGATION     UMBILICAL HERNIA REPAIR     2015 & 2017    Family History  Problem Relation Age of Onset   Hypertension Mother    Hypertension Father    Diabetes Father    Migraines Neg Hx      Social Connections: Unknown (04/01/2018)   Social Connection and Isolation Panel  [NHANES]    Frequency of Communication with Friends and Family: Not on file    Frequency of Social Gatherings with Friends and Family: Not on file    Attends Religious Services: Not on file    Active Member of Clubs or Organizations: Not on file    Attends Banker Meetings: Not on file    Marital Status: Married      Current Outpatient Medications:    b complex vitamins capsule, Take 1 capsule by mouth daily., Disp: 30 capsule, Rfl: 11   buPROPion  (WELLBUTRIN  XL) 300 MG 24 hr tablet, Take 1 tablet (300 mg total) by mouth in the morning., Disp: 90 tablet, Rfl: 3   Cholecalciferol (VITAMIN D3) 50 MCG (2000 UT) capsule, Take 1 capsule (2,000 Units total) by mouth daily., Disp: 30 capsule, Rfl: 11   clonazePAM  (KLONOPIN ) 0.5 MG tablet, Take 1 tablet (0.5 mg total) by mouth 2 (two) times daily as needed for anxiety., Disp: 60 tablet, Rfl: 5   Erenumab -aooe (AIMOVIG ) 140 MG/ML SOAJ, Inject once monthly. needs appointment for any further refills. (Patient not taking: Reported on 04/06/2021), Disp: 1 mL, Rfl: 4   Fish Oil-Cholecalciferol (OMEGA-3 + D) 500-200 MG-UNIT CAPS, Take 1 capsule by mouth daily., Disp: 30 capsule, Rfl: 11   FLUoxetine  (PROZAC ) 40 MG capsule, Take 2 capsules (80 mg total) by mouth daily., Disp: 180 capsule, Rfl: 3   gabapentin  (NEURONTIN ) 300 MG capsule, Take 3 capsules BY MOUTH EVERY evening 3-4 hours before bed., Disp: 270 capsule, Rfl: 1   hydrOXYzine  (ATARAX ) 10 MG tablet, Take 1-3 tablets (10-30 mg total) by mouth every 8 (eight) hours as needed., Disp: 90 tablet, Rfl: 1   medroxyPROGESTERone  (PROVERA ) 10 MG tablet, Take 1 tablet (10 mg total) by mouth daily. (Patient not taking: Reported on 04/06/2021), Disp: 10 tablet, Rfl: 0   modafinil  (PROVIGIL ) 200 MG tablet, Take 1 tablet (200 mg total) by mouth daily as needed., Disp: 30 tablet, Rfl: 5   Multiple Vitamins-Minerals (MULTIVITAMIN GUMMIES ADULT) CHEW, Chew 1 tablet by mouth daily., Disp: 30 tablet, Rfl:  11   propranolol  ER (INDERAL  LA) 80 MG 24 hr capsule, TAKE ONE CAPSULE BY MOUTH AT BEDTIME (Patient not taking: Reported on 09/19/2022), Disp: 180 capsule, Rfl: 6   rizatriptan  (MAXALT -MLT) 10 MG disintegrating tablet, Take 1 tablet (10 mg total) by mouth as needed for migraine. May repeat in 2 hours if needed (Patient not taking: Reported on 04/06/2021), Disp: 9 tablet, Rfl: 11   rosuvastatin (CRESTOR) 5 MG tablet, Take 5 mg by mouth at bedtime., Disp: , Rfl:    UNABLE TO FIND, CPAP, Disp: , Rfl:    Vitamin D, Ergocalciferol, (DRISDOL) 1.25 MG (50000 UNIT) CAPS capsule, Take 50,000 Units by mouth every 7 (seven) days., Disp: , Rfl:    Physical Exam:   There were no vitals taken for this visit.  Pertinent Findings  CN II-XII intact Bilateral EAC clear and TM intact with well pneumatized middle ear spaces Weber 512: equal Rinne 512: AC >  BC b/l  Anterior rhinoscopy: Septum left deviation; bilateral inferior turbinates with no hypertrophy No lesions of oral cavity/oropharynx; dentition wnl No obviously palpable neck masses/lymphadenopathy/thyromegaly No respiratory distress or stridor    Seprately Identifiable Procedures:  None  Impression & Plans:  Daris Aristizabal is a 48 y.o. female with the following   Hearing loss-  48 year old female seen in our office for evaluation of hearing loss.  She has symmetric sensorineural hearing loss predominantly from 250 through 4000 Hz.  At this time no intervention indicated.  If she finds any difficulty with day-to-day activities she can consider hearing aids.  Dizziness-  Uncertain etiology although vertiginous migraines high in the differential.  She has no red flags today.  She does not elect for any further evaluation or management of this.  She may return on an as-needed basis.     - f/u PRN   Thank you for allowing me the opportunity to care for your patient. Please do not hesitate to contact me should you have any other  questions.  Sincerely, Belma Boxer PA-C [facility] Phone: [phone] Fax: [phone]  11/20/2023, 10:30 AM

## 2023-11-20 NOTE — Progress Notes (Signed)
  [address], [address] [place], [place] [address] [phone]  Audiological Evaluation    Name: Amanda Marks     DOB:   21-Dec-1975      MRN:   [id number]                                                                                     Service Date: 11/20/2023     Accompanied by: yes   Patient comes today after [name], PA-C sent a referral for a hearing evaluation due to concerns with Eustachian tube dysfunction.   Symptoms Yes Details  Hearing loss  [x]  Perceived in both ears, per PCP hearing screening she had hearing loss in the left ear. Reports the hearing loss to have maybe been perceived like a year ago.  Tinnitus  [x]  Sometimes in both ears  Ear pain/ infections/pressure  []    Balance problems  []    Noise exposure history  [x]  In the past - Loud music and shooting  Previous ear surgeries  []    Family history of hearing loss  [x]  Father- unclear etiology, but she also went to [place]  Amplification  []    Other  []      Otoscopy: Right ear: Clear external ear canal and notable landmarks visualized on the tympanic membrane. Left ear:  Clear external ear canal and notable landmarks visualized on the tympanic membrane.  Tympanometry: Right ear: Type A- Normal external ear canal volume with normal middle ear pressure and tympanic membrane compliance. Left ear: Type A- Normal external ear canal volume with normal middle ear pressure and tympanic membrane compliance.   Pure tone Audiometry: Right ear- Normal to mild sensorineural hearing loss from 125 Hz - 8000 Hz, with a cookie bite configuration. Left ear-  Normal to mild sensorineural hearing loss from 125 Hz - 8000 Hz, with a cookie bite configuration.  Speech Audiometry: Right ear- Speech Reception Threshold (SRT) was obtained at 25 dBHL. Left ear-Speech Reception Threshold (SRT) was obtained at 25 dBHL.   Word Recognition Score Tested using NU-6 (recorded) Right ear: 100% was obtained at a  presentation level of 65 dBHL with contralateral masking which is deemed as  excellent. Left ear: 100% was obtained at a presentation level of 65 dBHL with contralateral masking which is deemed as  excellent.   The hearing test results were completed under headphones and re-checked with inserts and results are deemed to be of good reliability. Test technique:  conventional     Recommendations: Follow up with ENT as scheduled for today. Consider a communication needs assessment after medical clearance for hearing aids is obtained. Repeat audiogram in 2 years, before with concerns of hearing changes or as per MD.   Glenn Lange, AUD

## 2023-11-20 NOTE — Progress Notes (Signed)
 [facility]  [name],  MRN: [id number]  PCP: Verdia Glad, NP  Date of Evaluation: 11/20/2023 Time spent:30 minutes  Chief Complaint:  Chief Complaint   Anxiety; Depression; Follow-up    HISTORY/CURRENT STATUS: HPI For routine med check.  Is tired all the time.  Has been out of work a lot lately, next month she's decreasing hours to part-time b/c of the fatigue.  She uses her CPAP every night. Still wakes up tired and stays that way all day. States she's not depressed.  No extreme sadness, tearfulness, or feelings of hopelessness.  ADLs and personal hygiene are normal.   Denies any changes in concentration, making decisions, or remembering things.  Appetite has not changed.  Weight is stable.  Gets anxious at times and needs the Klonopin .  Not having PA but feels overwhelmed a lot.  Denies suicidal or homicidal thoughts.  Patient denies increased energy with decreased need for sleep, increased talkativeness, racing thoughts, impulsivity or risky behaviors, increased spending, increased libido, grandiosity, increased irritability or anger, paranoia, or hallucinations.  Review of Systems  Constitutional:  Positive for malaise/fatigue.  HENT: Negative.    Eyes: Negative.   Respiratory: Negative.    Cardiovascular: Negative.   Gastrointestinal: Negative.   Genitourinary: Negative.   Musculoskeletal: Negative.   Skin: Negative.   Neurological: Negative.   Endo/Heme/Allergies: Negative.   Psychiatric/Behavioral:         See HPI    Individual Medical History/ Review of Systems: Changes? :No     Past medications for mental health diagnoses include: Lamictal, Abilify, Prozac , Zoloft, Celexa, Lexapro, Paxil, Cymbalta, Wellbutrin , lithium, Depakote, Seroquel, Risperdal, Zyprexa, gabapentin , Klonopin , Xanax, trazodone , BuSpar, Luvox, Ativan  Allergies: Patient has no known allergies.  Current Medications:  Current Outpatient Medications:    b  complex vitamins capsule, Take 1 capsule by mouth daily., Disp: 30 capsule, Rfl: 11   Cholecalciferol (VITAMIN D3) 50 MCG (2000 UT) capsule, Take 1 capsule (2,000 Units total) by mouth daily., Disp: 30 capsule, Rfl: 11   Fish Oil-Cholecalciferol (OMEGA-3 + D) 500-200 MG-UNIT CAPS, Take 1 capsule by mouth daily., Disp: 30 capsule, Rfl: 11   hydrOXYzine  (ATARAX ) 10 MG tablet, Take 1-3 tablets (10-30 mg total) by mouth every 8 (eight) hours as needed., Disp: 90 tablet, Rfl: 1   Multiple Vitamins-Minerals (MULTIVITAMIN GUMMIES ADULT) CHEW, Chew 1 tablet by mouth daily., Disp: 30 tablet, Rfl: 11   UNABLE TO FIND, CPAP, Disp: , Rfl:    buPROPion  (WELLBUTRIN  XL) 300 MG 24 hr tablet, Take 1 tablet (300 mg total) by mouth in the morning., Disp: 90 tablet, Rfl: 1   clonazePAM  (KLONOPIN ) 0.5 MG tablet, Take 1 tablet (0.5 mg total) by mouth 2 (two) times daily as needed for anxiety., Disp: 60 tablet, Rfl: 5   Erenumab -aooe (AIMOVIG ) 140 MG/ML SOAJ, Inject once monthly. needs appointment for any further refills. (Patient not taking: Reported on 04/06/2021), Disp: 1 mL, Rfl: 4   FLUoxetine  (PROZAC ) 40 MG capsule, Take 2 capsules (80 mg total) by mouth daily., Disp: 180 capsule, Rfl: 1   gabapentin  (NEURONTIN ) 300 MG capsule, Take 3 capsules BY MOUTH EVERY evening 3-4 hours before bed., Disp: 270 capsule, Rfl: 1   medroxyPROGESTERone  (PROVERA ) 10 MG tablet, Take 1 tablet (10 mg total) by mouth daily. (Patient not taking: Reported on 11/20/2023), Disp: 10 tablet, Rfl: 0   modafinil  (PROVIGIL ) 200 MG tablet, Take 1 tablet (200 mg total) by mouth daily as needed., Disp: 30 tablet, Rfl: 5  propranolol  ER (INDERAL  LA) 80 MG 24 hr capsule, TAKE ONE CAPSULE BY MOUTH AT BEDTIME (Patient not taking: Reported on 11/20/2023), Disp: 180 capsule, Rfl: 6   rizatriptan  (MAXALT -MLT) 10 MG disintegrating tablet, Take 1 tablet (10 mg total) by mouth as needed for migraine. May repeat in 2 hours if needed (Patient not taking: Reported on  04/06/2021), Disp: 9 tablet, Rfl: 11   rosuvastatin (CRESTOR) 5 MG tablet, Take 5 mg by mouth at bedtime. (Patient not taking: Reported on 11/20/2023), Disp: , Rfl:    Vitamin D, Ergocalciferol, (DRISDOL) 1.25 MG (50000 UNIT) CAPS capsule, Take 50,000 Units by mouth every 7 (seven) days., Disp: , Rfl:  Medication Side Effects: none  Family Medical/ Social History: Changes? No  MENTAL HEALTH EXAM:  There were no vitals taken for this visit.There is no height or weight on file to calculate BMI.  General Appearance: Casual, Well Groomed, and Obese  Eye Contact:  Good  Speech:  Clear and Coherent and Normal Rate  Volume:  Normal  Mood:  Euthymic  Affect:  Congruent  Thought Process:  Goal Directed and Descriptions of Associations: Intact  Orientation:  Full (Time, Place, and Person)  Thought Content: Logical   Suicidal Thoughts:  No  Homicidal Thoughts:  No  Memory:  WNL  Judgement:  Good  Insight:  Good  Psychomotor Activity:  Normal  Concentration:  Concentration: Good and Attention Span: Good  Recall:  Good  Fund of Knowledge: Good  Language: Good  Assets:  Desire for Improvement Financial Resources/Insurance Housing Transportation Vocational/Educational  ADL's:  Intact  Cognition: WNL  Prognosis:  Good   DIAGNOSES:    ICD-10-CM   1. Depression, major, recurrent, in partial remission (HCC)  F33.41 CBC with Differential/Platelet    Comprehensive metabolic panel    TSH    B12 and Folate Panel    VITAMIN D 25 Hydroxy (Vit-D Deficiency, Fractures)    2. Malaise and fatigue  R53.81 CBC with Differential/Platelet   R53.83 Comprehensive metabolic panel    TSH    B12 and Folate Panel    VITAMIN D 25 Hydroxy (Vit-D Deficiency, Fractures)    3. Generalized anxiety disorder  F41.1 TSH    4. Obstructive sleep apnea  G47.33     5. Encounter for long-term (current) use of medications  Z79.899       Receiving Psychotherapy: No     RECOMMENDATIONS:  PDMP reviewed.  Last  Klonopin  filled 04/04/2023.  Gabapentin  filled 01/18/2023. I provided 30 minutes of face to face time during this encounter, including time spent before and after the visit in records review, medical decision making, counseling pertinent to today's visit, and charting.   We discussed the fatigue.  She has not had labs in awhile so I will order them.  If anything is abnormal and not related to her mental health I will ask her to see primary care.  She understands.  Healthy lifestyle choices recommended.  Lose weight.  Continue Wellbutrin  XL 300 mg,1 p.o. every morning. Continue Klonopin  0.5 mg, 1/2-1 po bid prn. Cont Prozac  40 mg, 2 qd. Continue gabapentin  300 mg, 3 p.o. nightly a few hours before bed. Continue hydroxyzine  10 mg, up to 3 p.o. 3 times daily as needed anxiety. Continue modafinil  200 mg, one half p.o. daily as needed.(Rarely takes) Continue multivitamin, vitamin D, B complex, fish oil. Continue CPAP use every night. Labs ordered as noted above. Return in 3 months.  Marvia Slocumb, PA-C

## 2024-01-29 ENCOUNTER — Encounter: Payer: Self-pay | Admitting: Professional Counselor

## 2024-01-29 ENCOUNTER — Ambulatory Visit (INDEPENDENT_AMBULATORY_CARE_PROVIDER_SITE_OTHER): Payer: PRIVATE HEALTH INSURANCE | Admitting: Professional Counselor

## 2024-01-29 DIAGNOSIS — F4010 Social phobia, unspecified: Secondary | ICD-10-CM

## 2024-01-29 DIAGNOSIS — F411 Generalized anxiety disorder: Secondary | ICD-10-CM

## 2024-01-29 DIAGNOSIS — F332 Major depressive disorder, recurrent severe without psychotic features: Secondary | ICD-10-CM | POA: Diagnosis not present
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
## 2024-02-20 ENCOUNTER — Ambulatory Visit (INDEPENDENT_AMBULATORY_CARE_PROVIDER_SITE_OTHER): Payer: Self-pay | Admitting: Physician Assistant

## 2024-02-20 DIAGNOSIS — Z91199 Patient's noncompliance with other medical treatment and regimen due to unspecified reason: Secondary | ICD-10-CM

## 2024-02-20 NOTE — Progress Notes (Signed)
 No show

## 2024-03-07 ENCOUNTER — Ambulatory Visit: Payer: PRIVATE HEALTH INSURANCE | Admitting: Physician Assistant

## 2024-03-12 ENCOUNTER — Ambulatory Visit: Payer: PRIVATE HEALTH INSURANCE | Admitting: Professional Counselor

## 2024-03-27 ENCOUNTER — Ambulatory Visit: Payer: Self-pay | Admitting: Professional Counselor

## 2024-04-15 ENCOUNTER — Encounter: Payer: Self-pay | Admitting: Radiology

## 2024-04-22 ENCOUNTER — Ambulatory Visit: Payer: PRIVATE HEALTH INSURANCE | Admitting: Professional Counselor

## 2024-04-25 ENCOUNTER — Ambulatory Visit: Payer: PRIVATE HEALTH INSURANCE | Admitting: Physician Assistant

## 2024-05-08 ENCOUNTER — Encounter: Payer: Self-pay | Admitting: Physician Assistant

## 2024-05-08 ENCOUNTER — Ambulatory Visit (INDEPENDENT_AMBULATORY_CARE_PROVIDER_SITE_OTHER): Payer: PRIVATE HEALTH INSURANCE | Admitting: Physician Assistant

## 2024-05-08 DIAGNOSIS — F3341 Major depressive disorder, recurrent, in partial remission: Secondary | ICD-10-CM

## 2024-05-08 DIAGNOSIS — G4700 Insomnia, unspecified: Secondary | ICD-10-CM

## 2024-05-08 DIAGNOSIS — F411 Generalized anxiety disorder: Secondary | ICD-10-CM | POA: Diagnosis not present

## 2024-05-08 DIAGNOSIS — G4733 Obstructive sleep apnea (adult) (pediatric): Secondary | ICD-10-CM

## 2024-05-08 MED ORDER — MODAFINIL 200 MG PO TABS: 200.0000 mg | ORAL_TABLET | Freq: Every day | ORAL | 5 refills | Status: AC | PRN

## 2024-05-08 MED ORDER — ACETYLCYSTEINE 600 MG PO CAPS: 600.0000 mg | ORAL_CAPSULE | Freq: Two times a day (BID) | ORAL | Status: AC

## 2024-05-08 MED ORDER — TRAZODONE HCL 100 MG PO TABS: 50.0000 mg | ORAL_TABLET | Freq: Every evening | ORAL | 1 refills | Status: AC | PRN

## 2024-05-08 MED ORDER — GABAPENTIN 300 MG PO CAPS: ORAL_CAPSULE | ORAL | 1 refills | Status: AC

## 2024-05-08 MED ORDER — BUPROPION HCL ER (XL) 300 MG PO TB24: 300.0000 mg | ORAL_TABLET | Freq: Every morning | ORAL | 1 refills | Status: AC

## 2024-05-08 MED ORDER — FLUOXETINE HCL 40 MG PO CAPS: 80.0000 mg | ORAL_CAPSULE | Freq: Every day | ORAL | 1 refills | Status: AC

## 2024-05-08 MED ORDER — CLONAZEPAM 0.5 MG PO TABS: 0.5000 mg | ORAL_TABLET | Freq: Two times a day (BID) | ORAL | 5 refills | Status: AC | PRN

## 2024-05-08 NOTE — Progress Notes (Signed)
 [facility] Med Check  [name],  MRN: [id number]  PCP: Zachary Lamar BRAVO, NP  Date of Evaluation: 05/08/2024 Time spent:20 minutes  Chief Complaint:  Chief Complaint   Depression; Anxiety; Follow-up    HISTORY/CURRENT STATUS: HPI For routine med check.  Still has a lot of fatigue, uses her CPAP every night. Often wakes up in the middle of the night and has a hard time going back to sleep.  She has taken trazodone  in the past and wonders if it is appropriate to try it again.  She has tried the Klonopin  for sleep but is not really helpful for that.  It does help the anxiety though.  She did not get the labs drawn that were ordered at the last visit because she has not had insurance.  She will be getting a new insurance effective 05/13/2024 and will get the labs then.  She is having perimenopausal issues, with menses for 3 months now.  That will be dealt with after she gets insurance as well.  Energy and motivation are fair to good depending on the day.  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  No extreme sadness, tearfulness, or feelings of hopelessness.   ADLs and personal hygiene are normal.   States she is having trouble word finding sometimes and is more forgetful occasionally.  Appetite has not changed.  Weight is stable.  No mania, delirium, AH/VH.  No SI/HI.  Individual Medical History/ Review of Systems: Changes? :No     Past medications for mental health diagnoses include: Lamictal, Abilify, Prozac , Zoloft, Celexa, Lexapro, Paxil, Cymbalta, Wellbutrin , lithium, Depakote, Seroquel, Risperdal, Zyprexa, gabapentin , Klonopin , Xanax, trazodone , BuSpar, Luvox, Ativan  Allergies: Patient has no known allergies.  Current Medications:  Current Outpatient Medications:    Acetylcysteine  600 MG CAPS, Take 1 capsule (600 mg total) by mouth 2 (two) times daily., Disp: , Rfl:    amLODipine -valsartan  (EXFORGE) 10-160 MG tablet, SMARTSIG:1 Tablet(s) By Mouth, Disp: , Rfl:    ASPIRIN LOW DOSE 81 MG tablet, Take 81 mg by mouth daily., Disp: , Rfl:    b complex vitamins capsule, Take 1 capsule by mouth daily., Disp: 30 capsule, Rfl: 11   Cholecalciferol (VITAMIN D3) 50 MCG (2000 UT) capsule, Take 1 capsule (2,000 Units total) by mouth daily., Disp: 30 capsule, Rfl: 11   Fish Oil-Cholecalciferol (OMEGA-3 + D) 500-200 MG-UNIT CAPS, Take 1 capsule by mouth daily., Disp: 30 capsule, Rfl: 11   hydrOXYzine  (ATARAX ) 10 MG tablet, Take 1-3 tablets (10-30 mg total) by mouth every 8 (eight) hours as needed., Disp: 90 tablet, Rfl: 1   Multiple Vitamins-Minerals (MULTIVITAMIN GUMMIES ADULT) CHEW, Chew 1 tablet by mouth daily., Disp: 30 tablet, Rfl: 11   rosuvastatin (CRESTOR) 5 MG tablet, Take 5 mg by mouth at bedtime., Disp: , Rfl:    traZODone  (DESYREL ) 100 MG tablet, Take 0.5-1 tablets (50-100 mg total) by mouth at bedtime as needed for sleep., Disp: 90 tablet, Rfl: 1   UNABLE TO FIND, CPAP, Disp: , Rfl:    buPROPion  (WELLBUTRIN  XL) 300 MG 24 hr tablet, Take 1 tablet (300 mg total) by mouth in the morning., Disp: 90 tablet, Rfl: 1   clonazePAM  (KLONOPIN ) 0.5 MG tablet, Take 1 tablet (0.5 mg total) by mouth 2 (two) times daily as needed for anxiety., Disp: 60 tablet, Rfl: 5   Erenumab -aooe (AIMOVIG ) 140 MG/ML SOAJ, Inject once monthly. needs appointment  for any further refills. (Patient not taking: Reported on 05/08/2024), Disp: 1 mL, Rfl: 4   FLUoxetine  (PROZAC ) 40 MG capsule, Take 2 capsules (80 mg total) by mouth daily., Disp: 180 capsule, Rfl: 1   gabapentin  (NEURONTIN ) 300 MG capsule, Take 3 capsules BY MOUTH EVERY evening 3-4 hours before bed., Disp: 270 capsule, Rfl: 1   medroxyPROGESTERone  (PROVERA ) 10 MG tablet, Take 1 tablet (10 mg total) by mouth daily. (Patient not taking: Reported on 11/20/2023), Disp: 10 tablet, Rfl: 0   modafinil  (PROVIGIL ) 200 MG tablet, Take 1 tablet (200 mg total) by mouth  daily as needed., Disp: 30 tablet, Rfl: 5   propranolol  ER (INDERAL  LA) 80 MG 24 hr capsule, TAKE ONE CAPSULE BY MOUTH AT BEDTIME (Patient not taking: Reported on 05/08/2024), Disp: 180 capsule, Rfl: 6   rizatriptan  (MAXALT -MLT) 10 MG disintegrating tablet, Take 1 tablet (10 mg total) by mouth as needed for migraine. May repeat in 2 hours if needed (Patient not taking: Reported on 05/08/2024), Disp: 9 tablet, Rfl: 11   Vitamin D, Ergocalciferol, (DRISDOL) 1.25 MG (50000 UNIT) CAPS capsule, Take 50,000 Units by mouth every 7 (seven) days. (Patient not taking: Reported on 05/08/2024), Disp: , Rfl:  Medication Side Effects: none  Family Medical/ Social History: Changes? No  MENTAL HEALTH EXAM:  There were no vitals taken for this visit.There is no height or weight on file to calculate BMI.  General Appearance: Casual, Well Groomed, and Obese  Eye Contact:  Good  Speech:  Clear and Coherent and Normal Rate  Volume:  Normal  Mood:  Euthymic  Affect:  Congruent  Thought Process:  Goal Directed and Descriptions of Associations: Intact  Orientation:  Full (Time, Place, and Person)  Thought Content: Logical   Suicidal Thoughts:  No  Homicidal Thoughts:  No  Memory:  WNL  Judgement:  Good  Insight:  Good  Psychomotor Activity:  Normal  Concentration:  Concentration: Good and Attention Span: Good  Recall:  Good  Fund of Knowledge: Good  Language: Good  Assets:  Communication Skills Desire for Improvement Housing Transportation Vocational/Educational  ADL's:  Intact  Cognition: WNL  Prognosis:  Good   DIAGNOSES:    ICD-10-CM   1. Depression, major, recurrent, in partial remission  F33.41     2. Generalized anxiety disorder  F41.1     3. Insomnia, unspecified type  G47.00     4. Obstructive sleep apnea  G47.33       Receiving Psychotherapy: No     RECOMMENDATIONS:  PDMP reviewed.  Last Klonopin  filled 04/04/2023.  Gabapentin  filled 01/18/2023. I provided approximately 20  minutes of face to face time during this encounter, including time spent before and after the visit in records review, medical decision making, counseling pertinent to today's visit, and charting.   We discussed that memory/word finding.  I recommend adding NAC.  Sleep hygiene discussed.  Will restart trazodone  as needed.  Start NAC 600 mg, 1 p.o. twice daily. Continue Wellbutrin  XL 300 mg,1 p.o. every morning. Continue Klonopin  0.5 mg, 1/2-1 po bid prn. Cont Prozac  40 mg, 2 qd. Continue gabapentin  300 mg, 3 p.o. nightly a few hours before bed. Continue hydroxyzine  10 mg, up to 3 p.o. 3 times daily as needed anxiety. Continue modafinil  200 mg, one half p.o. daily as needed. Start trazodone  100 mg, 1/2-1 nightly as needed sleep. Continue multivitamin, vitamin D, B complex, fish oil. Continue CPAP use every night. Get labs drawn as soon as she can. Return in  6 months.  Verneita Cooks, PA-C

## 2024-05-20 ENCOUNTER — Ambulatory Visit (INDEPENDENT_AMBULATORY_CARE_PROVIDER_SITE_OTHER): Payer: PRIVATE HEALTH INSURANCE | Admitting: Professional Counselor

## 2024-05-29 LAB — B12 AND FOLATE PANEL
Folate: 11.5 ng/mL (ref >3.0)
Vitamin B-12: 1300 pg/mL — ABNORMAL HIGH (ref 232–1245)

## 2024-05-29 LAB — CBC WITH DIFFERENTIAL/PLATELET
Basophils Absolute: 0.1 x10E3/uL (ref 0.0–0.2)
Basos: 2 %
EOS (ABSOLUTE): 0.6 x10E3/uL — ABNORMAL HIGH (ref 0.0–0.4)
Eos: 10 %
Hematocrit: 39.5 % (ref 34.0–46.6)
Hemoglobin: 13 g/dL (ref 11.1–15.9)
Immature Grans (Abs): 0.1 x10E3/uL (ref 0.0–0.1)
Immature Granulocytes: 1 %
Lymphocytes Absolute: 1.7 x10E3/uL (ref 0.7–3.1)
Lymphs: 29 %
MCH: 29.5 pg (ref 26.6–33.0)
MCHC: 32.9 g/dL (ref 31.5–35.7)
MCV: 90 fL (ref 79–97)
Monocytes Absolute: 0.4 x10E3/uL (ref 0.1–0.9)
Monocytes: 7 %
Neutrophils Absolute: 3.1 x10E3/uL (ref 1.4–7.0)
Neutrophils: 51 %
Platelets: 249 x10E3/uL (ref 150–450)
RBC: 4.41 x10E6/uL (ref 3.77–5.28)
RDW: 13.2 % (ref 11.7–15.4)
WBC: 6 x10E3/uL (ref 3.4–10.8)

## 2024-05-29 LAB — COMPREHENSIVE METABOLIC PANEL WITH GFR
ALT: 25 IU/L (ref 0–32)
AST: 25 IU/L (ref 0–40)
Albumin: 4.5 g/dL (ref 3.9–4.9)
Alkaline Phosphatase: 69 IU/L (ref 41–116)
BUN/Creatinine Ratio: 13 (ref 9–23)
BUN: 10 mg/dL (ref 6–24)
Bilirubin Total: 0.5 mg/dL (ref 0.0–1.2)
CO2: 19 mmol/L — ABNORMAL LOW (ref 20–29)
Calcium: 9.2 mg/dL (ref 8.7–10.2)
Chloride: 104 mmol/L (ref 96–106)
Creatinine, Ser: 0.75 mg/dL (ref 0.57–1.00)
Globulin, Total: 2.5 g/dL (ref 1.5–4.5)
Glucose: 88 mg/dL (ref 70–99)
Potassium: 4.2 mmol/L (ref 3.5–5.2)
Sodium: 142 mmol/L (ref 134–144)
Total Protein: 7 g/dL (ref 6.0–8.5)
eGFR: 98 mL/min/1.73 (ref >59)

## 2024-05-29 LAB — VITAMIN D 25 HYDROXY (VIT D DEFICIENCY, FRACTURES): Vit D, 25-Hydroxy: 53 ng/mL (ref 30.0–100.0)

## 2024-05-29 LAB — TSH: TSH: 1.46 u[IU]/mL (ref 0.450–4.500)

## 2024-05-30 ENCOUNTER — Ambulatory Visit: Payer: Self-pay | Admitting: Physician Assistant

## 2024-05-30 NOTE — Progress Notes (Signed)
 LVM to [facility]

## 2024-05-30 NOTE — Progress Notes (Signed)
 Please let her know labs are nl except B 12 is a little higher than it should be.  Have her take the B Complex 3 days a week.

## 2024-07-10 ENCOUNTER — Other Ambulatory Visit: Payer: Self-pay | Admitting: Medical Genetics

## 2024-07-17 ENCOUNTER — Encounter: Payer: Self-pay | Admitting: Professional Counselor

## 2024-07-17 ENCOUNTER — Ambulatory Visit: Payer: PRIVATE HEALTH INSURANCE | Admitting: Professional Counselor

## 2024-07-17 DIAGNOSIS — F411 Generalized anxiety disorder: Secondary | ICD-10-CM

## 2024-07-17 DIAGNOSIS — F331 Major depressive disorder, recurrent, moderate: Secondary | ICD-10-CM

## 2024-07-17 NOTE — Progress Notes (Unsigned)
 "       [facility] Counselor/Therapist Progress Note  [name], MRN: [id number],    Date: 07/19/2024  Time Spent: 11:16 AM to 12:12 PM  Treatment Type: Individual Therapy  I connected with this patient by an approved telecommunication method (video), with her informed consent, and verifying identity and patient privacy.  I was located at my office and patient at her home.  As needed, we discussed the limitations, risks, and security and privacy concerns associated with telehealth service, including the availability and conditions which currently govern in-person appointments and the possibility that 3rd-party payment may not be fully guaranteed and she may be responsible for charges.  After she indicated understanding, we proceeded with the session.  Also discussed treatment planning, as needed, including ongoing verbal agreement with the plan, the opportunity to ask and answer all questions, her demonstrated understanding of instructions, and her readiness to call the office should symptoms worsen or she feels she is in a crisis state and needs more immediate and tangible assistance.   Reported Symptoms: Stress, caregiver strain, sense of overwhelm, socially isolating tendencies, sadness, low mood, anhedonia, worries, anxiousness, restlessness, financial strain, sadness, vague SI no intent/plan; MDQ-endorsed symptomology: Hyperactivity, irritability, elevated self-confidence, pressured speech, mind racing, easily distracted, overly energetic, increased socialization, risky behavior by history, impulsive spending, with prior diagnosis, sister with bipolar diagnosis, patient reports minor problem in recent past and currently, with exacerbated impact her quality of life by history; PCL5-endorsed symptomology: Intrusive memories of trauma, feelings of trauma, physiological and emotional distress upon trauma cues, trouble remembering, negative feelings and beliefs, sense of brain,  anhedonia, emotional distancing, difficulty experiencing positive feelings, irritable behavior, hypervigilance, easily startled, trouble concentrating, trouble sleeping  Mental Status Exam:  Appearance:   Casual     Behavior:  Appropriate and Sharing  Motor:  Normal  Speech/Language:   Clear and Coherent and Normal Rate  Affect:  Depressed  Mood:  depressed and sad  Thought process:  normal  Thought content:    WNL  Sensory/Perceptual disturbances:    WNL  Orientation:  oriented to person, place, time/date, and situation  Attention:  Good  Concentration:  Good  Memory:  WNL  Fund of knowledge:   Good  Insight:    Good  Judgment:   Good  Impulse Control:  Good   Risk Assessment: Danger to Self: Vague SI no intent/plan Self-injurious Behavior: No Danger to Others: No Duty to Warn:no Physical Aggression / Violence:No  Access to Firearms a concern: No  Gang Involvement:No   Subjective: Patient presented to session to address concerns of anxiety and depression, and trauma response pattern.  Counselor and patient continued to build rapport.  Counselor continued to assess patient's symptomology and personal history.  Counselor facilitated MDQ and patient scored 10 out of 13, and PCL 5 and patient scored a 53.  Patient however did not meet full criteria for PTSD per assessment due to score of 1 per avoidance criterion.  Counselor and patient discussed results and implications, including relevance of patient trauma response pattern symptomology, and discussed continuing to assess regarding indicating symptomology, including OCD like symptoms according to YBOCS.  Counselor and patient began to discuss patient treatment planning.  Patient identified exacerbated stress and overall symptomology at this time given caregiving role for husband and daughter, and continuum of high level of responsibility for both.  Patient identified to be fighting for peace and feeling invisible on a continuum, and  sense of inability to self  advocate for her needs.  Counselor encouraged patient to step back where she feels she is able, consider discussing couples counseling resourcing with spouse, resource additional support for daughter's needs, and prioritize self-care.  Patient identified helpfulness of medications in mitigating symptomology.  Interventions: Assertiveness/Communication, Solution-Oriented/Positive Psychology, Humanistic/Existential, Insight-Oriented, and Treatment Planning, Assessments  Diagnosis:   ICD-10-CM   1. Major depressive disorder, recurrent episode, moderate (HCC)  F33.1     2. Generalized anxiety disorder  F41.1       Plan: Patient is scheduled for follow-up; continue to build rapport, assess patient's symptoms including as relates OCD-like symptoms per YBOCS, assess patient personal history, and continue to develop patient treatment plan and obtain consent.  Patient short-term goal between sessions to be mindful of over functioning for others, resource additional support, consider couples counseling with spouse, and prioritize self-care.  Amanda Marks, [place]                   "

## 2024-07-31 ENCOUNTER — Ambulatory Visit: Payer: PRIVATE HEALTH INSURANCE | Admitting: Professional Counselor

## 2024-08-14 ENCOUNTER — Ambulatory Visit: Payer: PRIVATE HEALTH INSURANCE | Admitting: Professional Counselor

## 2024-08-28 ENCOUNTER — Ambulatory Visit: Admitting: Professional Counselor

## 2024-09-11 ENCOUNTER — Ambulatory Visit: Admitting: Professional Counselor

## 2024-09-25 ENCOUNTER — Ambulatory Visit: Admitting: Professional Counselor

## 2024-10-09 ENCOUNTER — Ambulatory Visit: Admitting: Professional Counselor

## 2024-11-06 ENCOUNTER — Ambulatory Visit: Payer: PRIVATE HEALTH INSURANCE | Admitting: Physician Assistant
